# Patient Record
Sex: Female | Born: 1945
Health system: Southern US, Community
[De-identification: ages and names within clinical notes are randomized; demographics above are authoritative.]

## PROBLEM LIST (undated history)

## (undated) DIAGNOSIS — M8580 Other specified disorders of bone density and structure, unspecified site: Secondary | ICD-10-CM

## (undated) DIAGNOSIS — I951 Orthostatic hypotension: Secondary | ICD-10-CM

## (undated) DIAGNOSIS — M419 Scoliosis, unspecified: Secondary | ICD-10-CM

## (undated) DIAGNOSIS — E7800 Pure hypercholesterolemia, unspecified: Secondary | ICD-10-CM

## (undated) DIAGNOSIS — R42 Dizziness and giddiness: Secondary | ICD-10-CM

## (undated) HISTORY — PX: WRIST SURGERY: SHX841

## (undated) HISTORY — PX: RETINAL DETACHMENT SURGERY: SHX105

## (undated) HISTORY — PX: TONSILLECTOMY: SUR1361

## (undated) HISTORY — PX: TUBAL LIGATION: SHX77

## (undated) HISTORY — DX: Dizziness and giddiness: R42

## (undated) HISTORY — PX: EYE SURGERY: SHX253

---

## 1999-02-12 ENCOUNTER — Other Ambulatory Visit: Admission: RE | Admit: 1999-02-12 | Discharge: 1999-02-12 | Payer: Self-pay | Admitting: *Deleted

## 2000-02-23 ENCOUNTER — Other Ambulatory Visit: Admission: RE | Admit: 2000-02-23 | Discharge: 2000-02-23 | Payer: Self-pay | Admitting: *Deleted

## 2000-06-01 ENCOUNTER — Encounter: Payer: Self-pay | Admitting: *Deleted

## 2000-06-01 ENCOUNTER — Encounter: Admission: RE | Admit: 2000-06-01 | Discharge: 2000-06-01 | Payer: Self-pay | Admitting: *Deleted

## 2001-04-04 ENCOUNTER — Other Ambulatory Visit: Admission: RE | Admit: 2001-04-04 | Discharge: 2001-04-04 | Payer: Self-pay | Admitting: *Deleted

## 2001-06-13 ENCOUNTER — Encounter: Admission: RE | Admit: 2001-06-13 | Discharge: 2001-06-13 | Payer: Self-pay | Admitting: *Deleted

## 2001-06-13 ENCOUNTER — Encounter: Payer: Self-pay | Admitting: *Deleted

## 2002-04-26 ENCOUNTER — Other Ambulatory Visit: Admission: RE | Admit: 2002-04-26 | Discharge: 2002-04-26 | Payer: Self-pay | Admitting: *Deleted

## 2002-06-27 ENCOUNTER — Encounter: Admission: RE | Admit: 2002-06-27 | Discharge: 2002-06-27 | Payer: Self-pay | Admitting: *Deleted

## 2002-06-27 ENCOUNTER — Encounter: Payer: Self-pay | Admitting: *Deleted

## 2003-07-04 ENCOUNTER — Encounter: Admission: RE | Admit: 2003-07-04 | Discharge: 2003-07-04 | Payer: Self-pay | Admitting: *Deleted

## 2003-10-23 ENCOUNTER — Ambulatory Visit (HOSPITAL_COMMUNITY): Admission: RE | Admit: 2003-10-23 | Discharge: 2003-10-24 | Payer: Self-pay | Admitting: Ophthalmology

## 2004-07-20 ENCOUNTER — Other Ambulatory Visit: Admission: RE | Admit: 2004-07-20 | Discharge: 2004-07-20 | Payer: Self-pay | Admitting: *Deleted

## 2004-09-07 ENCOUNTER — Encounter: Admission: RE | Admit: 2004-09-07 | Discharge: 2004-09-07 | Payer: Self-pay | Admitting: *Deleted

## 2004-09-18 ENCOUNTER — Encounter: Admission: RE | Admit: 2004-09-18 | Discharge: 2004-09-18 | Payer: Self-pay | Admitting: *Deleted

## 2005-12-07 ENCOUNTER — Encounter: Admission: RE | Admit: 2005-12-07 | Discharge: 2005-12-07 | Payer: Self-pay | Admitting: *Deleted

## 2006-09-15 ENCOUNTER — Other Ambulatory Visit: Admission: RE | Admit: 2006-09-15 | Discharge: 2006-09-15 | Payer: Self-pay | Admitting: *Deleted

## 2006-12-14 ENCOUNTER — Encounter: Admission: RE | Admit: 2006-12-14 | Discharge: 2006-12-14 | Payer: Self-pay | Admitting: *Deleted

## 2007-09-19 ENCOUNTER — Other Ambulatory Visit: Admission: RE | Admit: 2007-09-19 | Discharge: 2007-09-19 | Payer: Self-pay | Admitting: Family Medicine

## 2008-10-17 ENCOUNTER — Ambulatory Visit (HOSPITAL_BASED_OUTPATIENT_CLINIC_OR_DEPARTMENT_OTHER): Admission: RE | Admit: 2008-10-17 | Discharge: 2008-10-17 | Payer: Self-pay | Admitting: Orthopedic Surgery

## 2009-10-13 ENCOUNTER — Other Ambulatory Visit: Admission: RE | Admit: 2009-10-13 | Discharge: 2009-10-13 | Payer: Self-pay | Admitting: Family Medicine

## 2010-12-08 LAB — POCT HEMOGLOBIN-HEMACUE: Hemoglobin: 14 g/dL (ref 12.0–15.0)

## 2011-01-05 NOTE — Op Note (Signed)
NAME:  Jodi Cardenas, DRENNEN              ACCOUNT NO.:  [id number]   MEDICAL RECORD NO.:  [id number]          PATIENT TYPE:  AMB   LOCATION:  DSC                          FACILITY:  [facility]   PHYSICIAN:  Katy Fitch. Sypher, M.D. DATE OF BIRTH:  1946-06-22   DATE OF PROCEDURE:  10/17/2008  DATE OF DISCHARGE:                               OPERATIVE REPORT   PREOPERATIVE DIAGNOSIS:  Chronic stenosing tenosynovitis, right first  dorsal compartment unresponsive to splinting, injection, and activity  modification.   POSTOPERATIVE DIAGNOSIS:  Chronic stenosing tenosynovitis, right first  dorsal compartment unresponsive to splinting, injection, and activity  modification.   OPERATION:  Release of right first dorsal compartment.   OPERATING SURGEON:  Katy Fitch. Sypher, MD   ASSISTANT:  Marveen Reeks Dasnoit, PA-C   ANESTHESIA:  2% lidocaine, field block, and first dorsal compartment  block supplemented by IV sedation.   SUPERVISING ANESTHESIOLOGIST:  Zenon Mayo, MD   INDICATIONS:  [name] is a 65 year old woman referred for  evaluation and management by Dr. Juluis Rainier.  She was noted to  have chronic stenosing tenosynovitis of her right first dorsal  compartment.  This was unresponsive to splinting, activity modification,  and steroid injection.   Due to failed response to nonoperative measures, she is brought to the  operating room at this time for release of first dorsal compartment.   PROCEDURE:  [name] was brought to the operating room and placed  in supine position on the operating table.   Following the induction of light sedation, the right arm was prepped  with Betadine soap and solution, sterilely draped.  A 2% lidocaine was  infiltrated into the path of intended incision and into the first dorsal  compartment region.   The arm was then prepped with Betadine soap and solution, sterilely  draped.  On exsanguination of the right arm with an Esmarch  bandage,  arterial tourniquet was inflated to 220 mmHg.  Procedure commenced with  short 1-cm transverse incision directly over the palpably thickened  first dorsal compartment.  There was chronic inflammatory changes with  subcutaneous fibrosis matting the radial superficial sensory branches to  the compartment.  These were gently cleared with tenotomy scissors,  dissection, and use of a micro Freer.  Blunt retractors were placed.  First dorsal compartment was opaque and thickened to a 3-mm wall  diameter.  This was carefully incised along the axis of the tendons.  Two large tendons were identified within the compartment.  The dorsal  tendon with traction extended both the MP and IP joint of the thumb.  This was an anatomic variant that functioned as an extensor pollicis  longus and an extensor pollicis brevis.  The second tendon was an  abductor pollicis longus.  There were not multiple slips as typically  found.   We could not identify any septum between the tendons.   After complete release proximally and distally, the wound was inspected  for bleeding points and repaired with intradermal 3-0 Prolene and Steri-  Strips.   For aftercare, Ms. Lobello is advised to begin immediate range of motion  exercises to fingers and thumb.  We will see her back for followup in  our office in a week for suture removal.  There were no apparent  complications.      Katy Fitch Sypher, M.D.  Electronically Signed     RVS/MEDQ  D:  10/17/2008  T:  10/17/2008  Job:  [id number]   cc:   Dossie Der, MD

## 2011-01-08 NOTE — Discharge Summary (Signed)
NAME:  HOLIDAY, MCMENAMIN                          ACCOUNT NO.:  [id number]   MEDICAL RECORD NO.:  [id number]                   PATIENT TYPE:  OIB   LOCATION:  5729                                 FACILITY:  [facility]   PHYSICIAN:  Guadelupe Sabin, M.D.             DATE OF BIRTH:  01/30/1946   DATE OF ADMISSION:  10/23/2003  DATE OF DISCHARGE:  10/23/2003                                 DISCHARGE SUMMARY   This was an urgent outpatient admission of this 65 year old white female  admitted with a rhegmatogenous retinal detachment of the right eye.  The  patient had had previous retinal tears treated in the left eye in 2003.  The  patient had a positive family history of a mother with retinal detachments  in both eyes which I repaired.   HOSPITAL COURSE:  The patient was taken to the operating room where the  patient was evaluated and felt to be in satisfactory condition for the  proposed surgery.  The patient was operated on with a scleral buckling  procedure using solid silicone implants, cryo application, diathermy  application, external drainage of subretinal fluid, and intravitreal air  injection.  The patient tolerated the procedure under general anesthesia and  was taken to the recovery room and subsequently to the 23 hour observation  unit.  The patient was seen on the evening of surgery and the following  morning.  At the time of discharge, the vitreous was clear with small  residual air bubble superiorly tamponading the large retinal tear.  The  subretinal fluid had absorbed and the retina was reattached with a good  buckling indentation.  It was felt that the patient had achieved maximal  hospital benefit and could be discharged home to be follow in the office.  The patient was given a printed list of discharge instructions on the care  and use of the operative eye.  Discharge ocular medications as outlined were  TobraDex ophthalmic solution 1 drop four times a day, Cyclomydril  ophthalmic  solution 1 drop four times a day, Maxitrol and Atropine ointment at bedtime.   CONDITION ON DISCHARGE:  Improved.   DISCHARGE DIAGNOSIS:  1. Rhegmatogenous retinal detachment, right eye, multiple breaks.  2. History of retinal horseshoe tears, left eye, 2003.                                                Guadelupe Sabin, M.D.    HNJ/MEDQ  D:  10/24/2003  T:  10/24/2003  Job:  [id number]   cc:   Dr. Maryagnes Amos T. Nile Riggs, M.D.  Fax: [phone]

## 2011-01-08 NOTE — Op Note (Signed)
NAME:  Jodi Cardenas, Jodi Cardenas                          ACCOUNT NO.:  [id number]   MEDICAL RECORD NO.:  [id number]                   PATIENT TYPE:  OIB   LOCATION:  5729                                 FACILITY:  [facility]   PHYSICIAN:  Guadelupe Sabin, M.D.             DATE OF BIRTH:  Jul 13, 1946   DATE OF PROCEDURE:  10/23/2003  DATE OF DISCHARGE:                                 OPERATIVE REPORT   PREOPERATIVE DIAGNOSIS:  Rhegmatogenous retinal detachment, right eye,  multiple breaks.   POSTOPERATIVE DIAGNOSIS:  Rhegmatogenous retinal detachment, right eye,  multiple breaks.   OPERATION:  Scleral buckling procedure, right eye, using solid silicone  implants, #277 and #240, cryo application, diathermy application, external  drainage of subretinal fluid, and intravitreal air injection.   SURGEON:  Guadelupe Sabin, M.D.   ASSISTANT:  Nurse   ANESTHESIA:  General.   OPHTHALMOSCOPY:  As previously described.   OPERATIVE PROCEDURE:  After the patient was prepped and draped, lid traction  sutures were placed in the right upper and lower lids.  A lid speculum was  inserted.  A peritomy was performed adjacent to the limbus 360 degrees.  The  subconjunctival tissue was cleaned and the rectus muscles isolated and  looped with 4-0 silk traction sutures.  The sclera was inspected and found  to be in satisfactory condition for the scleral dissection from the 10 to 2  o'clock position.  Thinning was noted at the 9:30 position.  Indirect  ophthalmoscopy and localization was performed using the retinal cryoprobe.  Each retinal tear was treated in several small degenerative areas.  Lamellar  scleral dissection was then performed from the 10 to 2 o'clock position, the  bed measuring 9 mm in width.  Light diathermy applications were applied to  the intrascleral lamella.  A #277 solid silicone implant was placed under  four 4-0 green Mersilene sutures with 3-0 plain catgut reinforcements in the  dissected bed.  A #240 encircling band was placed about the globe and tied  with a suture at the 8 o'clock position.  Anchoring sutures were placed at  the 7:30 and 4 o'clock position to hold the encircling band in place.  After  repeat indirect ophthalmoscopy, it was elected to drain fluid in the bed at  the 10 o'clock position.  The incision was made through the intrascleral  lamella, the choroid exposed, treated with light diathermy, and then  perforated with the pin electrode.  An abundant amount of clear, subretinal  fluid drained.  The eye became hypotonus.  It was then elected to inject  approximately 0.5 to 0.6 mL of air into the vitreous cavity with a 30 gauge  needle at the 8 o'clock position 3.5 mm from the limbus.  This re-  established the intraocular pressure and tamponaded the retinal break.  Repeat indirect ophthalmoscopy showed the break to be on the implant and in  good position.  A small amount of residual subretinal fluid remained but it  was felt that it would absorb during the postoperative period with the air  tamponade.  It was then elected to close.  The sutures were tightened  permanently and tied.  The tension of the encircling band was adjusted.  The  patient had been given Diamox 500 mg early in the procedure to continue  lowering the intraocular pressure.  Tenon's capsule was pulled forward in  the four quadrants and tied as a separate layer.  Full strength Neosporin  was irrigated in the subtenon's space.  The conjunctiva was then pulled  forward and closed with a running 6-0 chromic catgut suture.  Depo-Garamycin  and Dexamethasone were injected in the sub-tenon space inferiorly.  Atropine  and Maxitrol ophthalmic  ointment were instilled in the conjunctival cul-de-sac.  A light patch and  protective shield were applied.  Duration of the procedure 1 1/2 hours.  The  patient tolerated the procedure well in general and left the operating room  for the recovery  room in good condition.                                               Guadelupe Sabin, M.D.    HNJ/MEDQ  D:  10/24/2003  T:  10/24/2003  Job:  [id number]   cc:   Dr. Maryagnes Amos T. Nile Riggs, M.D.  Fax: [phone]

## 2011-01-08 NOTE — H&P (Signed)
NAME:  Jodi Cardenas, Jodi Cardenas                          ACCOUNT NO.:  [id number]   MEDICAL RECORD NO.:  [id number]                   PATIENT TYPE:  OIB   LOCATION:  5729                                 FACILITY:  [facility]   PHYSICIAN:  Guadelupe Sabin, M.D.             DATE OF BIRTH:  04-15-1946   DATE OF ADMISSION:  10/23/2003  DATE OF DISCHARGE:                                HISTORY & PHYSICAL   REASON FOR ADMISSION:  This was an urgent outpatient admission of this 65-  year-old white female admitted with a rhegmatogenous retinal detachment of  the right eye.   HISTORY OF PRESENT ILLNESS:  This patient was seen in my office in July 2003  and was found to have retinal horseshoe tears of the left eye.  These were  treated in the office with transconjunctival cryopexy.  The patient was then  discharged and returned to her regular ophthalmologist, Dr. Jethro Bolus.  Suddenly, she began to note the onset of floaters in her right eye.  She was  seen by Dr. Nile Riggs and noted to have a retinal detachment.  The patient was  referred back to my office where this diagnosis was confirmed and  arrangements made for her outpatient admission at this time.   PAST MEDICAL HISTORY:  The patient is in good general health under the care  of Dr. Janey Greaser.  Dr. Janey Greaser feels the patient is in good general health  taking Fosamax 70 mg a week. No known allergies are present and the patient  is felt to be a good risk for the proposed surgery.   REVIEW OF SYMPTOMS:  No cardiorespiratory complaints.   PHYSICAL EXAMINATION:  VITAL SIGNS:  As recorded on admission.  GENERAL:  The patient is a pleasant, well developed, well nourished, white  female in no acute distress.  HEENT:  Eyes:  Visual acuity without correction less than 20/400 right eye,  20/40 left eye.  Applanation tonometry 15 mm right eye, 21 left eye.  External ocular and slit lamp examination normal.  Detailed retinal drawing  with indirect  ophthalmoscopy and scleral depression of the right eye reveals  a clear vitreous with a retinal detachment in the upper temporal quadrant.  A large, irregular horseshoe tear is noted at the 11 o'clock position and a  separate horseshoe tear at the 7:30 position.  There are several small  atrophic areas present in the retinal periphery.  The macular area is  detached.  Examination of the left eye reveals a clear vitreous with old  cryopexy scars at the 2 o'clock and 8 o'clock position.  No retinal  detachment is present in the left eye.  CHEST:  Lungs clear to percussion and auscultation.  HEART:  Normal sinus rhythm.  No cardiomegaly, no murmurs.  ABDOMEN:  Negative.  EXTREMITIES:  Negative.   ADMISSION DIAGNOSIS:  1. Rhegmatogenous retinal detachment, right eye.  2. History of retinal horseshoe  tears, left eye, in 2003.   PLAN:  Scleral buckling procedure right eye with possible vitrectomy.                                                Guadelupe Sabin, M.D.    HNJ/MEDQ  D:  10/24/2003  T:  10/24/2003  Job:  [id number]   cc:   Dr. Maryagnes Amos T. Nile Riggs, M.D.  Fax: [phone]

## 2011-12-17 DIAGNOSIS — G479 Sleep disorder, unspecified: Secondary | ICD-10-CM | POA: Diagnosis not present

## 2011-12-17 DIAGNOSIS — M899 Disorder of bone, unspecified: Secondary | ICD-10-CM | POA: Diagnosis not present

## 2011-12-17 DIAGNOSIS — Z79899 Other long term (current) drug therapy: Secondary | ICD-10-CM | POA: Diagnosis not present

## 2011-12-17 DIAGNOSIS — E7800 Pure hypercholesterolemia, unspecified: Secondary | ICD-10-CM | POA: Diagnosis not present

## 2011-12-17 DIAGNOSIS — M949 Disorder of cartilage, unspecified: Secondary | ICD-10-CM | POA: Diagnosis not present

## 2011-12-24 DIAGNOSIS — Z8262 Family history of osteoporosis: Secondary | ICD-10-CM | POA: Diagnosis not present

## 2011-12-24 DIAGNOSIS — M899 Disorder of bone, unspecified: Secondary | ICD-10-CM | POA: Diagnosis not present

## 2011-12-24 DIAGNOSIS — Z1231 Encounter for screening mammogram for malignant neoplasm of breast: Secondary | ICD-10-CM | POA: Diagnosis not present

## 2011-12-24 DIAGNOSIS — M949 Disorder of cartilage, unspecified: Secondary | ICD-10-CM | POA: Diagnosis not present

## 2012-04-19 DIAGNOSIS — K5730 Diverticulosis of large intestine without perforation or abscess without bleeding: Secondary | ICD-10-CM | POA: Diagnosis not present

## 2012-04-19 DIAGNOSIS — Z86010 Personal history of colonic polyps: Secondary | ICD-10-CM | POA: Diagnosis not present

## 2012-04-19 DIAGNOSIS — Z09 Encounter for follow-up examination after completed treatment for conditions other than malignant neoplasm: Secondary | ICD-10-CM | POA: Diagnosis not present

## 2012-05-22 DIAGNOSIS — Z961 Presence of intraocular lens: Secondary | ICD-10-CM | POA: Diagnosis not present

## 2012-06-16 DIAGNOSIS — G479 Sleep disorder, unspecified: Secondary | ICD-10-CM | POA: Diagnosis not present

## 2012-06-16 DIAGNOSIS — Z5189 Encounter for other specified aftercare: Secondary | ICD-10-CM | POA: Diagnosis not present

## 2012-06-16 DIAGNOSIS — E7800 Pure hypercholesterolemia, unspecified: Secondary | ICD-10-CM | POA: Diagnosis not present

## 2012-06-16 DIAGNOSIS — Z23 Encounter for immunization: Secondary | ICD-10-CM | POA: Diagnosis not present

## 2012-06-16 DIAGNOSIS — Z1331 Encounter for screening for depression: Secondary | ICD-10-CM | POA: Diagnosis not present

## 2012-12-15 DIAGNOSIS — M899 Disorder of bone, unspecified: Secondary | ICD-10-CM | POA: Diagnosis not present

## 2012-12-15 DIAGNOSIS — Z79899 Other long term (current) drug therapy: Secondary | ICD-10-CM | POA: Diagnosis not present

## 2012-12-15 DIAGNOSIS — M949 Disorder of cartilage, unspecified: Secondary | ICD-10-CM | POA: Diagnosis not present

## 2012-12-15 DIAGNOSIS — E7800 Pure hypercholesterolemia, unspecified: Secondary | ICD-10-CM | POA: Diagnosis not present

## 2012-12-15 DIAGNOSIS — G479 Sleep disorder, unspecified: Secondary | ICD-10-CM | POA: Diagnosis not present

## 2012-12-15 DIAGNOSIS — Z1331 Encounter for screening for depression: Secondary | ICD-10-CM | POA: Diagnosis not present

## 2013-01-02 DIAGNOSIS — Z1231 Encounter for screening mammogram for malignant neoplasm of breast: Secondary | ICD-10-CM | POA: Diagnosis not present

## 2013-05-31 DIAGNOSIS — Z961 Presence of intraocular lens: Secondary | ICD-10-CM | POA: Diagnosis not present

## 2013-09-11 DIAGNOSIS — N950 Postmenopausal bleeding: Secondary | ICD-10-CM | POA: Diagnosis not present

## 2013-09-13 DIAGNOSIS — M25519 Pain in unspecified shoulder: Secondary | ICD-10-CM | POA: Diagnosis not present

## 2013-09-14 DIAGNOSIS — N840 Polyp of corpus uteri: Secondary | ICD-10-CM | POA: Diagnosis not present

## 2013-09-18 ENCOUNTER — Ambulatory Visit: Payer: Medicare Other | Attending: Family Medicine | Admitting: Physical Therapy

## 2013-09-18 DIAGNOSIS — IMO0001 Reserved for inherently not codable concepts without codable children: Secondary | ICD-10-CM | POA: Diagnosis not present

## 2013-09-18 DIAGNOSIS — M25519 Pain in unspecified shoulder: Secondary | ICD-10-CM | POA: Diagnosis not present

## 2013-09-25 ENCOUNTER — Ambulatory Visit: Payer: Medicare Other | Attending: Family Medicine | Admitting: Physical Therapy

## 2013-09-25 DIAGNOSIS — M25519 Pain in unspecified shoulder: Secondary | ICD-10-CM | POA: Diagnosis not present

## 2013-09-25 DIAGNOSIS — IMO0001 Reserved for inherently not codable concepts without codable children: Secondary | ICD-10-CM | POA: Diagnosis not present

## 2013-09-26 DIAGNOSIS — M899 Disorder of bone, unspecified: Secondary | ICD-10-CM | POA: Diagnosis not present

## 2013-09-26 DIAGNOSIS — Z23 Encounter for immunization: Secondary | ICD-10-CM | POA: Diagnosis not present

## 2013-09-26 DIAGNOSIS — Z79899 Other long term (current) drug therapy: Secondary | ICD-10-CM | POA: Diagnosis not present

## 2013-09-26 DIAGNOSIS — M949 Disorder of cartilage, unspecified: Secondary | ICD-10-CM | POA: Diagnosis not present

## 2013-09-26 DIAGNOSIS — E7800 Pure hypercholesterolemia, unspecified: Secondary | ICD-10-CM | POA: Diagnosis not present

## 2013-10-02 ENCOUNTER — Ambulatory Visit: Payer: Medicare Other | Admitting: Physical Therapy

## 2013-10-04 ENCOUNTER — Ambulatory Visit: Payer: Medicare Other | Admitting: Physical Therapy

## 2013-10-08 ENCOUNTER — Ambulatory Visit: Payer: Medicare Other | Admitting: Physical Therapy

## 2013-10-11 ENCOUNTER — Ambulatory Visit: Payer: Medicare Other | Admitting: Physical Therapy

## 2013-10-16 ENCOUNTER — Ambulatory Visit: Payer: Medicare Other | Admitting: Physical Therapy

## 2013-10-29 ENCOUNTER — Ambulatory Visit: Payer: Medicare Other | Attending: Family Medicine | Admitting: Physical Therapy

## 2013-10-29 DIAGNOSIS — IMO0001 Reserved for inherently not codable concepts without codable children: Secondary | ICD-10-CM | POA: Diagnosis not present

## 2013-10-29 DIAGNOSIS — M25519 Pain in unspecified shoulder: Secondary | ICD-10-CM | POA: Insufficient documentation

## 2013-11-05 ENCOUNTER — Ambulatory Visit: Payer: Medicare Other | Admitting: Physical Therapy

## 2013-11-05 DIAGNOSIS — M25519 Pain in unspecified shoulder: Secondary | ICD-10-CM | POA: Diagnosis not present

## 2013-11-05 DIAGNOSIS — IMO0001 Reserved for inherently not codable concepts without codable children: Secondary | ICD-10-CM | POA: Diagnosis not present

## 2013-11-08 ENCOUNTER — Ambulatory Visit: Payer: Medicare Other | Admitting: Physical Therapy

## 2013-11-08 DIAGNOSIS — M25519 Pain in unspecified shoulder: Secondary | ICD-10-CM | POA: Diagnosis not present

## 2013-11-08 DIAGNOSIS — IMO0001 Reserved for inherently not codable concepts without codable children: Secondary | ICD-10-CM | POA: Diagnosis not present

## 2013-12-13 DIAGNOSIS — H10509 Unspecified blepharoconjunctivitis, unspecified eye: Secondary | ICD-10-CM | POA: Diagnosis not present

## 2014-02-05 DIAGNOSIS — M899 Disorder of bone, unspecified: Secondary | ICD-10-CM | POA: Diagnosis not present

## 2014-02-05 DIAGNOSIS — M949 Disorder of cartilage, unspecified: Secondary | ICD-10-CM | POA: Diagnosis not present

## 2014-02-05 DIAGNOSIS — Z1231 Encounter for screening mammogram for malignant neoplasm of breast: Secondary | ICD-10-CM | POA: Diagnosis not present

## 2014-03-28 DIAGNOSIS — M899 Disorder of bone, unspecified: Secondary | ICD-10-CM | POA: Diagnosis not present

## 2014-03-28 DIAGNOSIS — M949 Disorder of cartilage, unspecified: Secondary | ICD-10-CM | POA: Diagnosis not present

## 2014-03-28 DIAGNOSIS — E7800 Pure hypercholesterolemia, unspecified: Secondary | ICD-10-CM | POA: Diagnosis not present

## 2014-03-28 DIAGNOSIS — Z1331 Encounter for screening for depression: Secondary | ICD-10-CM | POA: Diagnosis not present

## 2014-03-28 DIAGNOSIS — Z79899 Other long term (current) drug therapy: Secondary | ICD-10-CM | POA: Diagnosis not present

## 2014-06-26 DIAGNOSIS — Z23 Encounter for immunization: Secondary | ICD-10-CM | POA: Diagnosis not present

## 2014-07-01 DIAGNOSIS — Z961 Presence of intraocular lens: Secondary | ICD-10-CM | POA: Diagnosis not present

## 2014-10-01 DIAGNOSIS — Z23 Encounter for immunization: Secondary | ICD-10-CM | POA: Diagnosis not present

## 2014-10-01 DIAGNOSIS — G479 Sleep disorder, unspecified: Secondary | ICD-10-CM | POA: Diagnosis not present

## 2014-10-01 DIAGNOSIS — E780 Pure hypercholesterolemia: Secondary | ICD-10-CM | POA: Diagnosis not present

## 2014-10-01 DIAGNOSIS — Z79899 Other long term (current) drug therapy: Secondary | ICD-10-CM | POA: Diagnosis not present

## 2014-10-01 DIAGNOSIS — R8290 Unspecified abnormal findings in urine: Secondary | ICD-10-CM | POA: Diagnosis not present

## 2014-10-01 DIAGNOSIS — R3915 Urgency of urination: Secondary | ICD-10-CM | POA: Diagnosis not present

## 2015-04-15 DIAGNOSIS — E780 Pure hypercholesterolemia: Secondary | ICD-10-CM | POA: Diagnosis not present

## 2015-04-15 DIAGNOSIS — Z79899 Other long term (current) drug therapy: Secondary | ICD-10-CM | POA: Diagnosis not present

## 2015-04-15 DIAGNOSIS — K5900 Constipation, unspecified: Secondary | ICD-10-CM | POA: Diagnosis not present

## 2015-04-15 DIAGNOSIS — M549 Dorsalgia, unspecified: Secondary | ICD-10-CM | POA: Diagnosis not present

## 2015-04-15 DIAGNOSIS — Z1389 Encounter for screening for other disorder: Secondary | ICD-10-CM | POA: Diagnosis not present

## 2015-04-15 DIAGNOSIS — G479 Sleep disorder, unspecified: Secondary | ICD-10-CM | POA: Diagnosis not present

## 2015-04-15 DIAGNOSIS — R3915 Urgency of urination: Secondary | ICD-10-CM | POA: Diagnosis not present

## 2015-04-15 DIAGNOSIS — M419 Scoliosis, unspecified: Secondary | ICD-10-CM | POA: Diagnosis not present

## 2015-04-21 DIAGNOSIS — M545 Low back pain: Secondary | ICD-10-CM | POA: Diagnosis not present

## 2015-04-24 DIAGNOSIS — M419 Scoliosis, unspecified: Secondary | ICD-10-CM | POA: Diagnosis not present

## 2015-04-24 DIAGNOSIS — M545 Low back pain: Secondary | ICD-10-CM | POA: Diagnosis not present

## 2015-04-24 DIAGNOSIS — Z1231 Encounter for screening mammogram for malignant neoplasm of breast: Secondary | ICD-10-CM | POA: Diagnosis not present

## 2015-04-30 DIAGNOSIS — M419 Scoliosis, unspecified: Secondary | ICD-10-CM | POA: Diagnosis not present

## 2015-04-30 DIAGNOSIS — M545 Low back pain: Secondary | ICD-10-CM | POA: Diagnosis not present

## 2015-05-13 DIAGNOSIS — M419 Scoliosis, unspecified: Secondary | ICD-10-CM | POA: Diagnosis not present

## 2015-05-13 DIAGNOSIS — M545 Low back pain: Secondary | ICD-10-CM | POA: Diagnosis not present

## 2015-05-15 DIAGNOSIS — M545 Low back pain: Secondary | ICD-10-CM | POA: Diagnosis not present

## 2015-05-15 DIAGNOSIS — M419 Scoliosis, unspecified: Secondary | ICD-10-CM | POA: Diagnosis not present

## 2015-05-23 DIAGNOSIS — M545 Low back pain: Secondary | ICD-10-CM | POA: Diagnosis not present

## 2015-05-23 DIAGNOSIS — M419 Scoliosis, unspecified: Secondary | ICD-10-CM | POA: Diagnosis not present

## 2015-05-26 DIAGNOSIS — M419 Scoliosis, unspecified: Secondary | ICD-10-CM | POA: Diagnosis not present

## 2015-05-26 DIAGNOSIS — M545 Low back pain: Secondary | ICD-10-CM | POA: Diagnosis not present

## 2015-05-29 DIAGNOSIS — M545 Low back pain: Secondary | ICD-10-CM | POA: Diagnosis not present

## 2015-05-29 DIAGNOSIS — M419 Scoliosis, unspecified: Secondary | ICD-10-CM | POA: Diagnosis not present

## 2015-05-30 DIAGNOSIS — M545 Low back pain: Secondary | ICD-10-CM | POA: Diagnosis not present

## 2015-06-03 DIAGNOSIS — M545 Low back pain: Secondary | ICD-10-CM | POA: Diagnosis not present

## 2015-06-03 DIAGNOSIS — M419 Scoliosis, unspecified: Secondary | ICD-10-CM | POA: Diagnosis not present

## 2015-06-05 DIAGNOSIS — M545 Low back pain: Secondary | ICD-10-CM | POA: Diagnosis not present

## 2015-06-05 DIAGNOSIS — M419 Scoliosis, unspecified: Secondary | ICD-10-CM | POA: Diagnosis not present

## 2015-06-10 DIAGNOSIS — Z23 Encounter for immunization: Secondary | ICD-10-CM | POA: Diagnosis not present

## 2015-06-10 DIAGNOSIS — M545 Low back pain: Secondary | ICD-10-CM | POA: Diagnosis not present

## 2015-06-10 DIAGNOSIS — M419 Scoliosis, unspecified: Secondary | ICD-10-CM | POA: Diagnosis not present

## 2015-06-12 DIAGNOSIS — M545 Low back pain: Secondary | ICD-10-CM | POA: Diagnosis not present

## 2015-06-12 DIAGNOSIS — M419 Scoliosis, unspecified: Secondary | ICD-10-CM | POA: Diagnosis not present

## 2015-06-17 DIAGNOSIS — M419 Scoliosis, unspecified: Secondary | ICD-10-CM | POA: Diagnosis not present

## 2015-06-17 DIAGNOSIS — M545 Low back pain: Secondary | ICD-10-CM | POA: Diagnosis not present

## 2015-06-19 DIAGNOSIS — M545 Low back pain: Secondary | ICD-10-CM | POA: Diagnosis not present

## 2015-06-19 DIAGNOSIS — M419 Scoliosis, unspecified: Secondary | ICD-10-CM | POA: Diagnosis not present

## 2015-07-01 DIAGNOSIS — M419 Scoliosis, unspecified: Secondary | ICD-10-CM | POA: Diagnosis not present

## 2015-07-01 DIAGNOSIS — M545 Low back pain: Secondary | ICD-10-CM | POA: Diagnosis not present

## 2015-07-07 DIAGNOSIS — M546 Pain in thoracic spine: Secondary | ICD-10-CM | POA: Diagnosis not present

## 2015-07-14 DIAGNOSIS — M47816 Spondylosis without myelopathy or radiculopathy, lumbar region: Secondary | ICD-10-CM | POA: Diagnosis not present

## 2015-07-30 DIAGNOSIS — M47816 Spondylosis without myelopathy or radiculopathy, lumbar region: Secondary | ICD-10-CM | POA: Diagnosis not present

## 2015-08-04 DIAGNOSIS — Z961 Presence of intraocular lens: Secondary | ICD-10-CM | POA: Diagnosis not present

## 2015-08-29 DIAGNOSIS — M545 Low back pain: Secondary | ICD-10-CM | POA: Diagnosis not present

## 2015-09-16 DIAGNOSIS — M545 Low back pain: Secondary | ICD-10-CM | POA: Diagnosis not present

## 2015-09-19 DIAGNOSIS — M47816 Spondylosis without myelopathy or radiculopathy, lumbar region: Secondary | ICD-10-CM | POA: Diagnosis not present

## 2015-10-23 DIAGNOSIS — E7800 Pure hypercholesterolemia, unspecified: Secondary | ICD-10-CM | POA: Diagnosis not present

## 2015-10-23 DIAGNOSIS — Z79899 Other long term (current) drug therapy: Secondary | ICD-10-CM | POA: Diagnosis not present

## 2015-10-23 DIAGNOSIS — G479 Sleep disorder, unspecified: Secondary | ICD-10-CM | POA: Diagnosis not present

## 2015-10-23 DIAGNOSIS — R3915 Urgency of urination: Secondary | ICD-10-CM | POA: Diagnosis not present

## 2015-10-23 DIAGNOSIS — M8580 Other specified disorders of bone density and structure, unspecified site: Secondary | ICD-10-CM | POA: Diagnosis not present

## 2015-12-08 DIAGNOSIS — Z0000 Encounter for general adult medical examination without abnormal findings: Secondary | ICD-10-CM | POA: Diagnosis not present

## 2015-12-08 DIAGNOSIS — R350 Frequency of micturition: Secondary | ICD-10-CM | POA: Diagnosis not present

## 2015-12-08 DIAGNOSIS — R32 Unspecified urinary incontinence: Secondary | ICD-10-CM | POA: Diagnosis not present

## 2015-12-08 DIAGNOSIS — R338 Other retention of urine: Secondary | ICD-10-CM | POA: Diagnosis not present

## 2015-12-08 DIAGNOSIS — R351 Nocturia: Secondary | ICD-10-CM | POA: Diagnosis not present

## 2015-12-08 DIAGNOSIS — N3941 Urge incontinence: Secondary | ICD-10-CM | POA: Diagnosis not present

## 2015-12-15 DIAGNOSIS — M47816 Spondylosis without myelopathy or radiculopathy, lumbar region: Secondary | ICD-10-CM | POA: Diagnosis not present

## 2016-01-06 DIAGNOSIS — M47816 Spondylosis without myelopathy or radiculopathy, lumbar region: Secondary | ICD-10-CM | POA: Diagnosis not present

## 2016-01-12 DIAGNOSIS — R350 Frequency of micturition: Secondary | ICD-10-CM | POA: Diagnosis not present

## 2016-01-12 DIAGNOSIS — N3941 Urge incontinence: Secondary | ICD-10-CM | POA: Diagnosis not present

## 2016-01-12 DIAGNOSIS — Z0000 Encounter for general adult medical examination without abnormal findings: Secondary | ICD-10-CM | POA: Diagnosis not present

## 2016-01-13 DIAGNOSIS — M47816 Spondylosis without myelopathy or radiculopathy, lumbar region: Secondary | ICD-10-CM | POA: Diagnosis not present

## 2016-01-16 DIAGNOSIS — M47816 Spondylosis without myelopathy or radiculopathy, lumbar region: Secondary | ICD-10-CM | POA: Diagnosis not present

## 2016-01-22 DIAGNOSIS — M47816 Spondylosis without myelopathy or radiculopathy, lumbar region: Secondary | ICD-10-CM | POA: Diagnosis not present

## 2016-02-17 DIAGNOSIS — M8589 Other specified disorders of bone density and structure, multiple sites: Secondary | ICD-10-CM | POA: Diagnosis not present

## 2016-02-27 DIAGNOSIS — M47816 Spondylosis without myelopathy or radiculopathy, lumbar region: Secondary | ICD-10-CM | POA: Diagnosis not present

## 2016-03-15 DIAGNOSIS — N3941 Urge incontinence: Secondary | ICD-10-CM | POA: Diagnosis not present

## 2016-03-15 DIAGNOSIS — R350 Frequency of micturition: Secondary | ICD-10-CM | POA: Diagnosis not present

## 2016-03-17 DIAGNOSIS — M8588 Other specified disorders of bone density and structure, other site: Secondary | ICD-10-CM | POA: Diagnosis not present

## 2016-03-19 DIAGNOSIS — M47816 Spondylosis without myelopathy or radiculopathy, lumbar region: Secondary | ICD-10-CM | POA: Diagnosis not present

## 2016-04-20 DIAGNOSIS — M47816 Spondylosis without myelopathy or radiculopathy, lumbar region: Secondary | ICD-10-CM | POA: Diagnosis not present

## 2016-05-11 DIAGNOSIS — Z79899 Other long term (current) drug therapy: Secondary | ICD-10-CM | POA: Diagnosis not present

## 2016-05-11 DIAGNOSIS — Z1389 Encounter for screening for other disorder: Secondary | ICD-10-CM | POA: Diagnosis not present

## 2016-05-11 DIAGNOSIS — E7800 Pure hypercholesterolemia, unspecified: Secondary | ICD-10-CM | POA: Diagnosis not present

## 2016-05-11 DIAGNOSIS — G479 Sleep disorder, unspecified: Secondary | ICD-10-CM | POA: Diagnosis not present

## 2016-05-11 DIAGNOSIS — Z23 Encounter for immunization: Secondary | ICD-10-CM | POA: Diagnosis not present

## 2016-05-25 DIAGNOSIS — Z1231 Encounter for screening mammogram for malignant neoplasm of breast: Secondary | ICD-10-CM | POA: Diagnosis not present

## 2016-05-26 DIAGNOSIS — M47816 Spondylosis without myelopathy or radiculopathy, lumbar region: Secondary | ICD-10-CM | POA: Diagnosis not present

## 2016-08-24 DIAGNOSIS — M47816 Spondylosis without myelopathy or radiculopathy, lumbar region: Secondary | ICD-10-CM | POA: Diagnosis not present

## 2016-09-20 DIAGNOSIS — M47816 Spondylosis without myelopathy or radiculopathy, lumbar region: Secondary | ICD-10-CM | POA: Diagnosis not present

## 2016-10-11 DIAGNOSIS — M47816 Spondylosis without myelopathy or radiculopathy, lumbar region: Secondary | ICD-10-CM | POA: Diagnosis not present

## 2016-10-11 DIAGNOSIS — M25512 Pain in left shoulder: Secondary | ICD-10-CM | POA: Diagnosis not present

## 2016-11-15 DIAGNOSIS — M8588 Other specified disorders of bone density and structure, other site: Secondary | ICD-10-CM | POA: Diagnosis not present

## 2016-11-15 DIAGNOSIS — Z79899 Other long term (current) drug therapy: Secondary | ICD-10-CM | POA: Diagnosis not present

## 2016-11-15 DIAGNOSIS — G479 Sleep disorder, unspecified: Secondary | ICD-10-CM | POA: Diagnosis not present

## 2016-11-15 DIAGNOSIS — M25519 Pain in unspecified shoulder: Secondary | ICD-10-CM | POA: Diagnosis not present

## 2016-11-15 DIAGNOSIS — M549 Dorsalgia, unspecified: Secondary | ICD-10-CM | POA: Diagnosis not present

## 2016-11-15 DIAGNOSIS — E7800 Pure hypercholesterolemia, unspecified: Secondary | ICD-10-CM | POA: Diagnosis not present

## 2017-01-10 DIAGNOSIS — Z961 Presence of intraocular lens: Secondary | ICD-10-CM | POA: Diagnosis not present

## 2017-01-14 ENCOUNTER — Emergency Department (HOSPITAL_COMMUNITY)
Admission: EM | Admit: 2017-01-14 | Discharge: 2017-01-15 | Disposition: A | Discharge status: Home / Self Care | Payer: Medicare Other | Attending: Emergency Medicine | Admitting: Emergency Medicine

## 2017-01-14 ENCOUNTER — Encounter (HOSPITAL_COMMUNITY): Payer: Self-pay

## 2017-01-14 DIAGNOSIS — R55 Syncope and collapse: Secondary | ICD-10-CM | POA: Diagnosis not present

## 2017-01-14 DIAGNOSIS — R350 Frequency of micturition: Secondary | ICD-10-CM | POA: Diagnosis not present

## 2017-01-14 DIAGNOSIS — N390 Urinary tract infection, site not specified: Secondary | ICD-10-CM | POA: Insufficient documentation

## 2017-01-14 DIAGNOSIS — R42 Dizziness and giddiness: Secondary | ICD-10-CM | POA: Diagnosis present

## 2017-01-14 DIAGNOSIS — I951 Orthostatic hypotension: Secondary | ICD-10-CM | POA: Insufficient documentation

## 2017-01-14 HISTORY — DX: Scoliosis, unspecified: M41.9

## 2017-01-14 HISTORY — DX: Pure hypercholesterolemia, unspecified: E78.00

## 2017-01-14 HISTORY — DX: Other specified disorders of bone density and structure, unspecified site: M85.80

## 2017-01-14 LAB — BASIC METABOLIC PANEL
Anion gap: 8 (ref 5–15)
BUN: 16 mg/dL (ref 6–20)
CALCIUM: 10.6 mg/dL — AB (ref 8.9–10.3)
CO2: 26 mmol/L (ref 22–32)
CREATININE: 0.79 mg/dL (ref 0.44–1.00)
Chloride: 104 mmol/L (ref 101–111)
GFR calc non Af Amer: >60 mL/min (ref >60)
Glucose, Bld: 99 mg/dL (ref 65–99)
Potassium: 3.7 mmol/L (ref 3.5–5.1)
SODIUM: 138 mmol/L (ref 135–145)

## 2017-01-14 LAB — CBC
HCT: 42.3 % (ref 36.0–46.0)
Hemoglobin: 14.1 g/dL (ref 12.0–15.0)
MCH: 31.6 pg (ref 26.0–34.0)
MCHC: 33.3 g/dL (ref 30.0–36.0)
MCV: 94.8 fL (ref 78.0–100.0)
PLATELETS: 210 10*3/uL (ref 150–400)
RBC: 4.46 MIL/uL (ref 3.87–5.11)
RDW: 13.3 % (ref 11.5–15.5)
WBC: 8.3 10*3/uL (ref 4.0–10.5)

## 2017-01-14 LAB — URINALYSIS, ROUTINE W REFLEX MICROSCOPIC
Bilirubin Urine: NEGATIVE
GLUCOSE, UA: NEGATIVE mg/dL
Hgb urine dipstick: NEGATIVE
KETONES UR: NEGATIVE mg/dL
LEUKOCYTES UA: NEGATIVE
Nitrite: POSITIVE — AB
PH: 8 (ref 5.0–8.0)
Protein, ur: NEGATIVE mg/dL
SPECIFIC GRAVITY, URINE: 1.009 (ref 1.005–1.030)
SQUAMOUS EPITHELIAL / LPF: NONE SEEN

## 2017-01-14 LAB — CBG MONITORING, ED: Glucose-Capillary: 94 mg/dL (ref 65–99)

## 2017-01-14 MED ORDER — DEXTROSE 5 % IV SOLN
1.0000 g | Freq: Once | INTRAVENOUS | Status: AC
  Administered 2017-01-14: 1 g via INTRAVENOUS
  Filled 2017-01-14: qty 10

## 2017-01-14 MED ORDER — SODIUM CHLORIDE 0.9 % IV BOLUS (SEPSIS)
1000.0000 mL | Freq: Once | INTRAVENOUS | Status: AC
  Administered 2017-01-14: 1000 mL via INTRAVENOUS

## 2017-01-14 MED ORDER — CEPHALEXIN 500 MG PO CAPS: 500.0000 mg | ORAL_CAPSULE | Freq: Three times a day (TID) | ORAL | 0 refills | Status: DC

## 2017-01-14 NOTE — Discharge Instructions (Signed)
Please take your antibiotic as prescribe for urinary tract infection.  Stay hydrated and follow up closely with your doctor for further care.  Return if you have any concerns.

## 2017-01-14 NOTE — ED Triage Notes (Signed)
Pt sent by [facility] for orthostatic hypotension. For the past 3 weeks she has been feeling dizzy with lack of energy. She went to [facility] today for evaluation and was told she was orthostatic and to come to the ED for further evaluation. Denies any pain, no syncope. A&Ox4.

## 2017-01-14 NOTE — ED Notes (Signed)
Up to br with assistance   Waiting for disposition

## 2017-01-14 NOTE — ED Provider Notes (Signed)
[place] DEPT Provider Note   CSN: [id number] Arrival date & time: 01/14/17  1706     History   Chief Complaint Chief Complaint  Patient presents with  . Orthostatic Hypotension    HPI Jodi Cardenas is a 71 y.o. female.  HPI   71 year old female with history of hypercholesterolemia, osteopenia, scoliosis presenting for evaluation of generalize fatigue and dizziness. Patient states for the past year she hasn't been feeling well. She complaining of recurrent bouts of lightheadedness and dizziness. For the past month she has noticed increased fatigue and dizziness. Symptom worse when she stands for a prolonged period of time. She also endorsed urinary frequency for the past month as well. States that she has been seen and evaluated by her urologist and was given some types of anti-cholinergic medication which did not provide adequate relief. She was seen by her doctor today because she was not feeling well. She was told that she has signs of orthostatic hypotension and recommended to come to the ER for further evaluation. She admits that she doesn't drink enough fluid but otherwise she denies any fever, headache, chest pain, shortness of breath, productive cough, nausea, vomiting, diarrhea or any abnormal bleeding. She was taking statin medication but recently discontinued a few days prior thinking that it may cause the symptoms. She is currently denying pain.  Past Medical History:  Diagnosis Date  . Hypercholesteremia   . Osteopenia   . Scoliosis     There are no active problems to display for this patient.   History reviewed. No pertinent surgical history.  OB History    No data available       Home Medications    Prior to Admission medications   Not on File    Family History No family history on file.  Social History Social History  Substance Use Topics  . Smoking status: Never Smoker  . Smokeless tobacco: Never Used  . Alcohol use Yes     Allergies     Patient has no allergy information on record.   Review of Systems Review of Systems  All other systems reviewed and are negative.    Physical Exam Updated Vital Signs BP (!) 143/75   Pulse 72   Temp 98.8 F (37.1 C) (Oral)   Resp 20   SpO2 98%   Physical Exam  Constitutional: She is oriented to person, place, and time. She appears well-developed and well-nourished. No distress.  HENT:  Head: Atraumatic.  Right Ear: External ear normal.  Left Ear: External ear normal.  Mouth/Throat: Oropharynx is clear and moist.  Eyes: Conjunctivae and EOM are normal. Pupils are equal, round, and reactive to light.  Neck: Neck supple.  No nuchal rigidity  Cardiovascular: Normal rate, regular rhythm and intact distal pulses.   Pulmonary/Chest: Effort normal and breath sounds normal.  Abdominal: Soft. Bowel sounds are normal. She exhibits no distension. There is no tenderness.  Musculoskeletal: Normal range of motion. She exhibits no edema or tenderness.  Neurological: She is alert and oriented to person, place, and time.  Skin: No rash noted.  Psychiatric: She has a normal mood and affect.  Nursing note and vitals reviewed.    ED Treatments / Results  Labs (all labs ordered are listed, but only abnormal results are displayed) Labs Reviewed  BASIC METABOLIC PANEL - Abnormal; Notable for the following:       Result Value   Calcium 10.6 (*)    All other components within normal limits  URINALYSIS, ROUTINE  W REFLEX MICROSCOPIC - Abnormal; Notable for the following:    APPearance CLOUDY (*)    Nitrite POSITIVE (*)    Bacteria, UA FEW (*)    All other components within normal limits  URINE CULTURE  CBC  CBG MONITORING, ED    EKG  EKG Interpretation  Date/Time:  Friday Jan 14 2017 17:37:11 EDT Ventricular Rate:  77 PR Interval:  156 QRS Duration: 74 QT Interval:  370 QTC Calculation: 418 R Axis:   47 Text Interpretation:  Normal sinus rhythm Nonspecific T wave abnormality  Confirmed by Ashok Cordia  MD, Lennette Bihari ([id number]) on 01/14/2017 8:45:09 PM       Radiology No results found.  Procedures Procedures (including critical care time)  Medications Ordered in ED Medications  sodium chloride 0.9 % bolus 1,000 mL (1,000 mLs Intravenous New Bag/Given 01/14/17 2228)  cefTRIAXone (ROCEPHIN) 1 g in dextrose 5 % 50 mL IVPB (1 g Intravenous New Bag/Given 01/14/17 2228)     Initial Impression / Assessment and Plan / ED Course  I have reviewed the triage vital signs and the nursing notes.  Pertinent labs & imaging results that were available during my care of the patient were reviewed by me and considered in my medical decision making (see chart for details).     BP (!) 155/80   Pulse 75   Temp 98.8 F (37.1 C) (Oral)   Resp (!) 24   SpO2 100%    Final Clinical Impressions(s) / ED Diagnoses   Final diagnoses:  Orthostatic hypotension  Acute lower UTI    New Prescriptions New Prescriptions   CEPHALEXIN (KEFLEX) 500 MG CAPSULE    Take 1 capsule (500 mg total) by mouth 3 (three) times daily.   9:26 PM Patient here with chronic fatigue, now with increased lightheadedness. She also endorsed urinary frequency. Her lab is remarkable for a urine that shows nitrite positive concerning for UTI. This finding may contribute to her present complaint. Will treat UTI with Rocephin. Otherwise no evidence of anemia. Her electrolytes panel are reassuring. Her EKG is without any concerning arrhythmia or acute ischemic changes. She is hemodynamically stable. No focal neuro deficit on exam concerning for stroke.  Care discussed with DR. Steinl.   11:01 PM Positive orthostasis on exam.  IVF given.  Pt resting comfortably.  Plan to discharge pt with Keflex, recommend hydration and close follow up with PCP for further care.  Return precaution discussed.  Pt voice understanding and agrees with plan.  She is stable for discharge.     Domenic Moras, PA-C 01/14/17 9147    Lajean Saver,  MD 01/15/17 1535

## 2017-01-16 LAB — URINE CULTURE: Culture: 50000 — AB

## 2017-01-17 ENCOUNTER — Telehealth: Payer: Self-pay | Admitting: Emergency Medicine

## 2017-01-17 NOTE — Telephone Encounter (Signed)
Post ED Visit - Positive Culture Follow-up  Culture report reviewed by antimicrobial stewardship pharmacist:  []  Elenor Quinones, Pharm.D. []  Heide Guile, Pharm.D., BCPS AQ-ID []  Parks Neptune, Pharm.D., BCPS []  Alycia Rossetti, Pharm.D., BCPS []  [place], [place].D., BCPS, AAHIVP []  Legrand Como, Pharm.D., BCPS, AAHIVP [x]  Salome Arnt, PharmD, BCPS []  Dimitri Ped, PharmD, BCPS []  Vincenza Hews, PharmD, BCPS  Positive urine culture Treated with cephalexin, organism sensitive to the same and no further patient follow-up is required at this time.  Hazle Nordmann 01/17/2017, 12:50 PM

## 2017-01-21 DIAGNOSIS — I951 Orthostatic hypotension: Secondary | ICD-10-CM | POA: Diagnosis not present

## 2017-01-24 DIAGNOSIS — H538 Other visual disturbances: Secondary | ICD-10-CM | POA: Diagnosis not present

## 2017-01-24 DIAGNOSIS — H27132 Posterior dislocation of lens, left eye: Secondary | ICD-10-CM | POA: Diagnosis not present

## 2017-01-26 DIAGNOSIS — H33322 Round hole, left eye: Secondary | ICD-10-CM | POA: Diagnosis not present

## 2017-01-26 DIAGNOSIS — H33059 Total retinal detachment, unspecified eye: Secondary | ICD-10-CM | POA: Diagnosis not present

## 2017-01-26 DIAGNOSIS — Z961 Presence of intraocular lens: Secondary | ICD-10-CM | POA: Diagnosis not present

## 2017-01-26 DIAGNOSIS — H43811 Vitreous degeneration, right eye: Secondary | ICD-10-CM | POA: Diagnosis not present

## 2017-01-26 DIAGNOSIS — H35432 Paving stone degeneration of retina, left eye: Secondary | ICD-10-CM | POA: Diagnosis not present

## 2017-01-28 DIAGNOSIS — M47816 Spondylosis without myelopathy or radiculopathy, lumbar region: Secondary | ICD-10-CM | POA: Diagnosis not present

## 2017-01-31 DIAGNOSIS — H33059 Total retinal detachment, unspecified eye: Secondary | ICD-10-CM | POA: Diagnosis not present

## 2017-01-31 DIAGNOSIS — H43811 Vitreous degeneration, right eye: Secondary | ICD-10-CM | POA: Diagnosis not present

## 2017-01-31 DIAGNOSIS — T8522XA Displacement of intraocular lens, initial encounter: Secondary | ICD-10-CM | POA: Diagnosis not present

## 2017-01-31 DIAGNOSIS — H33322 Round hole, left eye: Secondary | ICD-10-CM | POA: Diagnosis not present

## 2017-02-03 DIAGNOSIS — H2702 Aphakia, left eye: Secondary | ICD-10-CM | POA: Diagnosis not present

## 2017-02-03 DIAGNOSIS — T8522XA Displacement of intraocular lens, initial encounter: Secondary | ICD-10-CM | POA: Diagnosis not present

## 2017-02-03 DIAGNOSIS — H43392 Other vitreous opacities, left eye: Secondary | ICD-10-CM | POA: Diagnosis not present

## 2017-02-03 DIAGNOSIS — H4302 Vitreous prolapse, left eye: Secondary | ICD-10-CM | POA: Diagnosis not present

## 2017-02-08 DIAGNOSIS — I951 Orthostatic hypotension: Secondary | ICD-10-CM | POA: Diagnosis not present

## 2017-02-08 DIAGNOSIS — E7800 Pure hypercholesterolemia, unspecified: Secondary | ICD-10-CM | POA: Diagnosis not present

## 2017-02-08 DIAGNOSIS — N390 Urinary tract infection, site not specified: Secondary | ICD-10-CM | POA: Diagnosis not present

## 2017-02-24 DIAGNOSIS — N390 Urinary tract infection, site not specified: Secondary | ICD-10-CM | POA: Diagnosis not present

## 2017-02-25 ENCOUNTER — Ambulatory Visit (INDEPENDENT_AMBULATORY_CARE_PROVIDER_SITE_OTHER): Payer: Medicare Other | Admitting: Cardiovascular Disease

## 2017-02-25 ENCOUNTER — Encounter: Payer: Self-pay | Admitting: Cardiovascular Disease

## 2017-02-25 DIAGNOSIS — E7800 Pure hypercholesterolemia, unspecified: Secondary | ICD-10-CM

## 2017-02-25 DIAGNOSIS — E785 Hyperlipidemia, unspecified: Secondary | ICD-10-CM | POA: Insufficient documentation

## 2017-02-25 DIAGNOSIS — I951 Orthostatic hypotension: Secondary | ICD-10-CM | POA: Insufficient documentation

## 2017-02-25 NOTE — Assessment & Plan Note (Signed)
History of hyperlipidemia on statin therapy followed by her PCP. 

## 2017-02-25 NOTE — Assessment & Plan Note (Signed)
Ms. Jodi Cardenas is being seen today for evaluation of orthostatic hypotension. She has complained of dizziness for the last 3 months occurring primarily when changing positions. She was recently seen in the emergency room was noted noted to be orthostatic and given IV fluids. Her blood pressures today are; lying-148/76, pulse 64, sitting-145/78, pulse 72, standing-111/70, pulse 80, 3 minutes later her blood pressure rose while standing to 123/72. She did complain of some mild dizziness. She is clearly orthostatic. We'll get a 2-D echocardiogram. I suggested that she live liberalize her salt and water intake. I will see her back in 2 months. That time she has not improved we will consider putting her on fludrcotisone.

## 2017-02-25 NOTE — Patient Instructions (Signed)
Medication Instructions: Your physician recommends that you continue on your current medications as directed. Please refer to the Current Medication list given to you today.  Liberalize salt and water intake.    Testing/Procedures: Your physician has requested that you have an echocardiogram. Echocardiography is a painless test that uses sound waves to create images of your heart. It provides your doctor with information about the size and shape of your heart and how well your heart's chambers and valves are working. This procedure takes approximately one hour. There are no restrictions for this procedure.  Follow-Up: Your physician recommends that you schedule a follow-up appointment in: 2 months with Dr. Gwenlyn Found.  If you need a refill on your cardiac medications before your next appointment, please call your pharmacy.

## 2017-02-25 NOTE — Progress Notes (Signed)
02/25/2017 Jodi Cardenas   October 06, 1945  [id number]  Primary Physician Jodi Ruff, MD Primary Cardiologist: Jodi Harp MD Jodi Cardenas  HPI:  Jodi Cardenas is a 71 year old thin appearing married Caucasian female mother of 36, grandmother of one grandchild accompanied by her husband Jodi Cardenas today. She was referred by Dr. Drema Cardenas for cardiovascular evaluation because of orthostatic hypotension. She has a history of hyperlipidemia as well as family history of heart disease with a father who died of a myocardial infarction at age 82. She has never had a heart attack or stroke. She denies chest pain or shortness of breath. She does complain of fatigue over the last year dizziness over the last 3 months primarily when changing positions consistent with orthostasis. She was recently seen in the emergency room on 01/14/17 was noted to be orthostatic and treated with IV fluids. Today in the office she is significantly orthostatic blood pressures that dropped from 140/76 lying to 111/70 standing associated with mild dizziness.   Current Outpatient Prescriptions  Medication Sig Dispense Refill  . alendronate (FOSAMAX) 70 MG tablet     . aspirin 81 MG tablet Take 81 mg by mouth daily.    . Biotin 2500 MCG CAPS Take by mouth 2 (two) times daily.    . cephALEXin (KEFLEX) 500 MG capsule Take 1 capsule (500 mg total) by mouth 3 (three) times daily. 21 capsule 0  . Chlorphen-Pseudoephed-APAP (TYLENOL ALLERGY COMPLETE PO) Take by mouth.    . Coenzyme Q-10 100 MG capsule Take 100 mg by mouth daily.    Marland Kitchen MAGNESIUM GLYCINATE PLUS PO Take by mouth.    . meloxicam (MOBIC) 7.5 MG tablet     . Multiple Vitamin (MULTIVITAMIN) capsule Take 1 capsule by mouth daily.    . Probiotic Product (PROBIOTIC DAILY PO) Take by mouth.    . simvastatin (ZOCOR) 40 MG tablet     . zolpidem (AMBIEN) 10 MG tablet      No current facility-administered medications for this visit.     No Known Allergies  Social  History   Social History  . Marital status: Married    Spouse name: N/A  . Number of children: N/A  . Years of education: N/A   Occupational History  . Not on file.   Social History Main Topics  . Smoking status: Never Smoker  . Smokeless tobacco: Never Used  . Alcohol use Yes  . Drug use: Unknown  . Sexual activity: Not on file   Other Topics Concern  . Not on file   Social History Narrative  . No narrative on file     Review of Systems: General: negative for chills, fever, night sweats or weight changes.  Cardiovascular: negative for chest pain, dyspnea on exertion, edema, orthopnea, palpitations, paroxysmal nocturnal dyspnea or shortness of breath Dermatological: negative for rash Respiratory: negative for cough or wheezing Urologic: negative for hematuria Abdominal: negative for nausea, vomiting, diarrhea, bright red blood per rectum, melena, or hematemesis Neurologic: negative for visual changes, syncope, or dizziness All other systems reviewed and are otherwise negative except as noted above.    Blood pressure (!) 154/78, pulse 65, height 5\' 2"  (1.575 m), weight 117 lb (53.1 kg).  General appearance: alert and no distress Neck: no adenopathy, no carotid bruit, no JVD, supple, symmetrical, trachea midline and thyroid not enlarged, symmetric, no tenderness/mass/nodules Lungs: clear to auscultation bilaterally Heart: regular rate and rhythm, S1, S2 normal, no murmur, click, rub or gallop Extremities: extremities  normal, atraumatic, no cyanosis or edema  EKG sinus rhythm at 65 without ST or T-wave changes. I personally reviewed this EKG.  ASSESSMENT AND PLAN:   Hyperlipidemia History of hyperlipidemia on statin therapy followed by her PCP  Orthostatic hypotension Ms. Jodi Cardenas is being seen today for evaluation of orthostatic hypotension. She has complained of dizziness for the last 3 months occurring primarily when changing positions. She was recently seen in the  emergency room was noted noted to be orthostatic and given IV fluids. Her blood pressures today are; lying-148/76, pulse 64, sitting-145/78, pulse 72, standing-111/70, pulse 80, 3 minutes later her blood pressure rose while standing to 123/72. She did complain of some mild dizziness. She is clearly orthostatic. We'll get a 2-D echocardiogram. I suggested that she live liberalize her salt and water intake. I will see her back in 2 months. That time she has not improved we will consider putting her on fludrcotisone.       Jodi Harp MD [place], [facility] 02/25/2017 4:03 PM

## 2017-03-03 DIAGNOSIS — M47816 Spondylosis without myelopathy or radiculopathy, lumbar region: Secondary | ICD-10-CM | POA: Diagnosis not present

## 2017-03-08 ENCOUNTER — Ambulatory Visit (HOSPITAL_COMMUNITY): Payer: Medicare Other | Attending: Cardiology

## 2017-03-08 ENCOUNTER — Other Ambulatory Visit: Payer: Self-pay

## 2017-03-08 DIAGNOSIS — R350 Frequency of micturition: Secondary | ICD-10-CM | POA: Diagnosis not present

## 2017-03-08 DIAGNOSIS — I951 Orthostatic hypotension: Secondary | ICD-10-CM

## 2017-03-08 DIAGNOSIS — I700 Atherosclerosis of aorta: Secondary | ICD-10-CM | POA: Diagnosis not present

## 2017-03-18 DIAGNOSIS — R531 Weakness: Secondary | ICD-10-CM | POA: Diagnosis not present

## 2017-03-18 DIAGNOSIS — M549 Dorsalgia, unspecified: Secondary | ICD-10-CM | POA: Diagnosis not present

## 2017-03-18 DIAGNOSIS — I951 Orthostatic hypotension: Secondary | ICD-10-CM | POA: Diagnosis not present

## 2017-03-18 DIAGNOSIS — R42 Dizziness and giddiness: Secondary | ICD-10-CM | POA: Diagnosis not present

## 2017-03-18 DIAGNOSIS — R350 Frequency of micturition: Secondary | ICD-10-CM | POA: Diagnosis not present

## 2017-03-21 ENCOUNTER — Telehealth: Payer: Self-pay | Admitting: Cardiovascular Disease

## 2017-03-21 NOTE — Telephone Encounter (Signed)
Received records from [facility] for appointment on 05/04/17 with Dr Gwenlyn Found.  Records put with Dr Kennon Holter schedule for 05/04/17. lp

## 2017-03-25 DIAGNOSIS — M47816 Spondylosis without myelopathy or radiculopathy, lumbar region: Secondary | ICD-10-CM | POA: Diagnosis not present

## 2017-03-30 DIAGNOSIS — I10 Essential (primary) hypertension: Secondary | ICD-10-CM | POA: Diagnosis not present

## 2017-03-30 DIAGNOSIS — I951 Orthostatic hypotension: Secondary | ICD-10-CM | POA: Diagnosis not present

## 2017-04-04 ENCOUNTER — Encounter: Payer: Self-pay | Admitting: Neurology

## 2017-04-04 ENCOUNTER — Ambulatory Visit (INDEPENDENT_AMBULATORY_CARE_PROVIDER_SITE_OTHER): Payer: Medicare Other | Admitting: Neurology

## 2017-04-04 ENCOUNTER — Encounter (INDEPENDENT_AMBULATORY_CARE_PROVIDER_SITE_OTHER): Payer: Self-pay

## 2017-04-04 VITALS — BP 152/82 | HR 71 | Ht 62.0 in | Wt 116.0 lb

## 2017-04-04 DIAGNOSIS — M85851 Other specified disorders of bone density and structure, right thigh: Secondary | ICD-10-CM

## 2017-04-04 DIAGNOSIS — M85852 Other specified disorders of bone density and structure, left thigh: Secondary | ICD-10-CM

## 2017-04-04 DIAGNOSIS — M8580 Other specified disorders of bone density and structure, unspecified site: Secondary | ICD-10-CM | POA: Insufficient documentation

## 2017-04-04 DIAGNOSIS — M545 Low back pain: Secondary | ICD-10-CM

## 2017-04-04 DIAGNOSIS — I951 Orthostatic hypotension: Secondary | ICD-10-CM

## 2017-04-04 DIAGNOSIS — M549 Dorsalgia, unspecified: Secondary | ICD-10-CM

## 2017-04-04 DIAGNOSIS — R5383 Other fatigue: Secondary | ICD-10-CM | POA: Diagnosis not present

## 2017-04-04 DIAGNOSIS — G8929 Other chronic pain: Secondary | ICD-10-CM

## 2017-04-04 DIAGNOSIS — E538 Deficiency of other specified B group vitamins: Secondary | ICD-10-CM | POA: Diagnosis not present

## 2017-04-04 DIAGNOSIS — E7800 Pure hypercholesterolemia, unspecified: Secondary | ICD-10-CM

## 2017-04-04 DIAGNOSIS — R799 Abnormal finding of blood chemistry, unspecified: Secondary | ICD-10-CM | POA: Diagnosis not present

## 2017-04-04 MED ORDER — DULOXETINE HCL 60 MG PO CPEP: 60.0000 mg | ORAL_CAPSULE | Freq: Every day | ORAL | 11 refills | Status: DC

## 2017-04-04 NOTE — Patient Instructions (Signed)
[facility] in [place], [place] Address: [address] [place],  [place] [address]   Phone: [phone]

## 2017-04-04 NOTE — Progress Notes (Signed)
PATIENT: Jodi Cardenas DOB: [birth year]  Chief Complaint  Patient presents with  . Dizziness    Orthostatic Vitals: Lying:152/82, 71, Sitting: 160/80, 77, Standing: 138/78, 80, Standing at 3 minutes: 136/83, 76.  Reports positional dizziness for the last two months.    Marland Kitchen PCP    Leighton Ruff, MD     HISTORICAL  Jodi Cardenas is a 71 years old right-handed female, seen in refer by her primary care doctor  Leighton Ruff, for evaluation of dizziness, initial evaluation was April 04 2017.  I reviewed and summarized the referral note, she had a history of hyperlipidemia, osteopenia,chronic  insomnia, take Ambien10mg  prn. History of right eyelid retinal detachment.  I was able to review her ER presentation on Jan 14 2017, for evaluation of generalized fatigue and dizziness, not feeling well, recurrent lightheadedness and dizziness, there was documented orthostatic blood pressure changes, there was also evidence of UTI, her symptoms has much improved with IV steroid.  She had a history of kyphosis, scoliosis,progressive worsening neck to lower back pain, limited mobility, since May 2018, she began to notice dizziness, she only notices dizziness when she is up about, standing for few minutes, she felt lightheadedness, whoozing sensation, no vertigo, she denies similar dizziness in the sitting down or lying down position  Laboratory evaluations in July 2018, normal CMP, creatinine 0.77, normal TSH 1.90 CBC, hemoglobin of 13.6, triglyceride 96, triglycerides right148, LDL 61.  REVIEW OF SYSTEMS: Full 14 system review of systems performed and notable only for constipation, joint pain, urination problems, weakness, dizziness, insomnia  ALLERGIES: No Known Allergies  HOME MEDICATIONS: Current Outpatient Prescriptions  Medication Sig Dispense Refill  . alendronate (FOSAMAX) 70 MG tablet     . aspirin 81 MG tablet Take 81 mg by mouth daily.    . Biotin 2500 MCG CAPS Take by mouth 2  (two) times daily.    . Chlorphen-Pseudoephed-APAP (TYLENOL ALLERGY COMPLETE PO) Take by mouth.    . Coenzyme Q-10 100 MG capsule Take 100 mg by mouth daily.    . meloxicam (MOBIC) 7.5 MG tablet     . Multiple Vitamin (MULTIVITAMIN) capsule Take 1 capsule by mouth daily.    . Probiotic Product (PROBIOTIC DAILY PO) Take by mouth.    . simvastatin (ZOCOR) 20 MG tablet Take 20 mg by mouth daily.    Marland Kitchen zolpidem (AMBIEN) 10 MG tablet      No current facility-administered medications for this visit.     PAST MEDICAL HISTORY: Past Medical History:  Diagnosis Date  . Dizziness   . Hypercholesteremia   . Osteopenia   . Scoliosis     PAST SURGICAL HISTORY: Past Surgical History:  Procedure Laterality Date  . EYE SURGERY     lens Implants x 2  . RETINAL DETACHMENT SURGERY    . TONSILLECTOMY    . TUBAL LIGATION    . WRIST SURGERY Right     FAMILY HISTORY: Family History  Problem Relation Age of Onset  . Heart disease Father     SOCIAL HISTORY:  Social History   Social History  . Marital status: Married    Spouse name: N/A  . Number of children: 3  . Years of education: College   Occupational History  . Retired Therapist, sports    Social History Main Topics  . Smoking status: Never Smoker  . Smokeless tobacco: Never Used  . Alcohol use Yes     Comment: occasional wine  . Drug use: No  . Sexual  activity: Not on file   Other Topics Concern  . Not on file   Social History Narrative   Lives at home with husband.   Right-handed.   Two cups coffee per day.     PHYSICAL EXAM   Vitals:   04/04/17 1001  BP: (!) 152/82  Pulse: 71  Weight: 116 lb (52.6 kg)  Height: 5\' 2"  (1.575 m)    Not recorded      Body mass index is 21.22 kg/m.  PHYSICAL EXAMNIATION:  Gen: NAD, conversant, well nourised, obese, well groomed                     Cardiovascular: Regular rate rhythm, no peripheral edema, warm, nontender. Eyes: Conjunctivae clear without exudates or hemorrhage Neck:  Supple, no carotid bruits. Pulmonary: Clear to auscultation bilaterally   NEUROLOGICAL EXAM:  MENTAL STATUS: Speech:    Speech is normal; fluent and spontaneous with normal comprehension.  Cognition:     Orientation to time, place and person     Normal recent and remote memory     Normal Attention span and concentration     Normal Language, naming, repeating,spontaneous speech     Fund of knowledge   CRANIAL NERVES: CN II: Visual fields are full to confrontation. Fundoscopic exam is normal with sharp discs and no vascular changes. Pupils are round equal and briskly reactive to light. CN III, IV, VI: extraocular movement are normal. No ptosis. CN V: Facial sensation is intact to pinprick in all 3 divisions bilaterally. Corneal responses are intact.  CN VII: Face is symmetric with normal eye closure and smile. CN VIII: Hearing is normal to rubbing fingers CN IX, X: Palate elevates symmetrically. Phonation is normal. CN XI: Head turning and shoulder shrug are intact CN XII: Tongue is midline with normal movements and no atrophy.  MOTOR: There is no pronator drift of out-stretched arms. Muscle bulk and tone are normal. Muscle strength is normal.  REFLEXES: Reflexes are 2+ and symmetric at the biceps, triceps, knees, and ankles. Plantar responses are flexor.  SENSORY: length dependent decreased to light touch, pinprick and the vibratory sensation at toes, distal leg  COORDINATION: Rapid alternating movements and fine finger movements are intact. There is no dysmetria on finger-to-nose and heel-knee-shin.    GAIT/STANCE: She needs push up to get up from seated position, mildly unsteady, kyphosis   DIAGNOSTIC DATA (LABS, IMAGING, TESTING) - I reviewed patient records, labs, notes, testing and imaging myself where available.   ASSESSMENT AND PLAN  Jodi Cardenas is a 71 y.o. female   Orthostatic dizziness Evidence of length dependent sensory changes  Suggestive of peripheral  neuropathy  Laboratory evaluation for etiology  EMG nerve conduction study   Jodi Cardenas, M.D. Ph.D.  [facility] [address], [place], [place] [address] Ph: [phone] Fax: [phone]  CC: Leighton Ruff, MD

## 2017-04-06 LAB — MULTIPLE MYELOMA PANEL, SERUM
ALBUMIN/GLOB SERPL: 1.4 (ref 0.7–1.7)
Albumin SerPl Elph-Mcnc: 3.9 g/dL (ref 2.9–4.4)
Alpha 1: 0.2 g/dL (ref 0.0–0.4)
Alpha2 Glob SerPl Elph-Mcnc: 0.8 g/dL (ref 0.4–1.0)
B-Globulin SerPl Elph-Mcnc: 1 g/dL (ref 0.7–1.3)
GAMMA GLOB SERPL ELPH-MCNC: 1 g/dL (ref 0.4–1.8)
GLOBULIN, TOTAL: 3 g/dL (ref 2.2–3.9)
IGA/IMMUNOGLOBULIN A, SERUM: 255 mg/dL (ref 64–422)
IGM (IMMUNOGLOBULIN M), SRM: 75 mg/dL (ref 26–217)
IgG (Immunoglobin G), Serum: 929 mg/dL (ref 700–1600)
Total Protein: 6.9 g/dL (ref 6.0–8.5)

## 2017-04-06 LAB — CK: CK TOTAL: 54 U/L (ref 24–173)

## 2017-04-06 LAB — ANA W/REFLEX IF POSITIVE: Anti Nuclear Antibody(ANA): NEGATIVE

## 2017-04-06 LAB — HEMOGLOBIN A1C
Est. average glucose Bld gHb Est-mCnc: 108 mg/dL
Hgb A1c MFr Bld: 5.4 % (ref 4.8–5.6)

## 2017-04-06 LAB — VITAMIN B1: Thiamine: 162.3 nmol/L (ref 66.5–200.0)

## 2017-04-06 LAB — SEDIMENTATION RATE: SED RATE: 2 mm/h (ref 0–40)

## 2017-04-06 LAB — C-REACTIVE PROTEIN: CRP: 0.4 mg/L (ref 0.0–4.9)

## 2017-04-06 LAB — VITAMIN D 25 HYDROXY (VIT D DEFICIENCY, FRACTURES): VIT D 25 HYDROXY: 52.9 ng/mL (ref 30.0–100.0)

## 2017-04-06 LAB — THYROID PANEL WITH TSH
FREE THYROXINE INDEX: 2.1 (ref 1.2–4.9)
T3 Uptake Ratio: 29 % (ref 24–39)
T4 TOTAL: 7.2 ug/dL (ref 4.5–12.0)
TSH: 2.53 u[IU]/mL (ref 0.450–4.500)

## 2017-04-06 LAB — VITAMIN B12: Vitamin B-12: 1769 pg/mL — ABNORMAL HIGH (ref 232–1245)

## 2017-04-06 LAB — COPPER, SERUM: COPPER: 95 ug/dL (ref 72–166)

## 2017-04-07 ENCOUNTER — Telehealth: Payer: Self-pay | Admitting: Cardiovascular Disease

## 2017-04-07 DIAGNOSIS — M47816 Spondylosis without myelopathy or radiculopathy, lumbar region: Secondary | ICD-10-CM | POA: Diagnosis not present

## 2017-04-07 NOTE — Telephone Encounter (Signed)
Pt says her blood pressure is dropping 30 points lower when she stands up and she gets dizzy. Pt would like to be seen asap please.

## 2017-04-07 NOTE — Telephone Encounter (Signed)
Returned call to patient, patient reports she has continued to experience orthostatic hypotension since seeing Dr. Evangeline Cardenas.  It has not improved.   Reports she took her BP and sitting it was 130/70 and standing it was 100/60, reports feeling dizzy, doesn't feel safe to walk.   Reports she is trying to stay hydrated and liberalize he salt intake.      Last OV with Dr. Gwenlyn Cardenas: Orthostatic hypotension Ms. Jodi Cardenas is being seen today for evaluation of orthostatic hypotension. She has complained of dizziness for the last 3 months occurring primarily when changing positions. She was recently seen in the emergency room was noted noted to be orthostatic and given IV fluids. Her blood pressures today are; lying-148/76, pulse 64, sitting-145/78, pulse 72, standing-111/70, pulse 80, 3 minutes later her blood pressure rose while standing to 123/72. She did complain of some mild dizziness. She is clearly orthostatic. We'll get a 2-D echocardiogram. I suggested that she live liberalize her salt and water intake. I will see her back in 2 months. That time she has not improved we will consider putting her on fludrcotisone.    Echo 7/17-normal   Advised I would route message to Dr. Gwenlyn Cardenas for review.  Patient aware and verbalized understanding.

## 2017-04-08 NOTE — Telephone Encounter (Signed)
Have her follow-up with the mid-level provider

## 2017-04-20 NOTE — Telephone Encounter (Signed)
Message sent to scheduling to arrange this appointment.

## 2017-04-22 ENCOUNTER — Ambulatory Visit (INDEPENDENT_AMBULATORY_CARE_PROVIDER_SITE_OTHER): Payer: Medicare Other | Admitting: Neurology

## 2017-04-22 DIAGNOSIS — G8929 Other chronic pain: Secondary | ICD-10-CM

## 2017-04-22 DIAGNOSIS — M545 Low back pain: Secondary | ICD-10-CM | POA: Diagnosis not present

## 2017-04-22 DIAGNOSIS — I951 Orthostatic hypotension: Secondary | ICD-10-CM

## 2017-04-22 DIAGNOSIS — E7800 Pure hypercholesterolemia, unspecified: Secondary | ICD-10-CM

## 2017-04-22 MED ORDER — PYRIDOSTIGMINE BROMIDE 60 MG PO TABS: 60.0000 mg | ORAL_TABLET | Freq: Three times a day (TID) | ORAL | 6 refills | Status: DC

## 2017-04-22 NOTE — Progress Notes (Signed)
PATIENT: Jodi Cardenas DOB: 12-05-1945  No chief complaint on file.    HISTORICAL  Jodi Cardenas is a 71 years old right-handed female, seen in refer by her primary care doctor  Leighton Ruff, for evaluation of dizziness, initial evaluation was April 04 2017.  I reviewed and summarized the referral note, she had a history of hyperlipidemia, osteopenia,chronic  insomnia, take Ambien44m prn. History of right eyelid retinal detachment.  I was able to review her ER presentation on Jan 14 2017, for evaluation of generalized fatigue and dizziness, not feeling well, recurrent lightheadedness and dizziness, there was documented orthostatic blood pressure changes, there was also evidence of UTI, her symptoms has much improved with IV steroid.  She had a history of kyphosis, scoliosis,progressive worsening neck to lower back pain, limited mobility, since May 2018, she began to notice dizziness, she only notices dizziness when she is up about, standing for few minutes, she felt lightheadedness, whoozing sensation, no vertigo, she denies similar dizziness in the sitting down or lying down position  Laboratory evaluations in July 2018, normal CMP, creatinine 0.77, normal TSH 1.90 CBC, hemoglobin of 13.6, triglyceride 96, triglycerides right148, LDL 61.  UPDATE April 22 2017: Laboratory evaluation showed normal ESR C-reactive protein, protein electrophoresis, ANA, copper, vitamin B1, D, CPK, thyroid functional tasks, B12, A1c was 5.4.  Today's electrodiagnostic studies normal, there is no evidence of peripheral neuropathy, she tried Cymbalta 60 mg daily, did not help her symptoms, she continue complaining significant multiple joints pain, shoulder pain, chronic low back pain, dizziness when getting up quickly, husband reported a positive orthostatic blood pressure changes.   REVIEW OF SYSTEMS: Full 14 system review of systems performed and notable only for constipation, joint pain, urination  problems, weakness, dizziness, insomnia  ALLERGIES: No Known Allergies  HOME MEDICATIONS: Current Outpatient Prescriptions  Medication Sig Dispense Refill  . alendronate (FOSAMAX) 70 MG tablet     . aspirin 81 MG tablet Take 81 mg by mouth daily.    . Biotin 2500 MCG CAPS Take by mouth 2 (two) times daily.    . Chlorphen-Pseudoephed-APAP (TYLENOL ALLERGY COMPLETE PO) Take by mouth.    . Coenzyme Q-10 100 MG capsule Take 100 mg by mouth daily.    . DULoxetine (CYMBALTA) 60 MG capsule Take 1 capsule (60 mg total) by mouth daily. 30 capsule 11  . meloxicam (MOBIC) 7.5 MG tablet     . Multiple Vitamin (MULTIVITAMIN) capsule Take 1 capsule by mouth daily.    . Probiotic Product (PROBIOTIC DAILY PO) Take by mouth.    . simvastatin (ZOCOR) 20 MG tablet Take 20 mg by mouth daily.    .Marland Kitchenzolpidem (AMBIEN) 10 MG tablet      No current facility-administered medications for this visit.     PAST MEDICAL HISTORY: Past Medical History:  Diagnosis Date  . Dizziness   . Hypercholesteremia   . Osteopenia   . Scoliosis     PAST SURGICAL HISTORY: Past Surgical History:  Procedure Laterality Date  . EYE SURGERY     lens Implants x 2  . RETINAL DETACHMENT SURGERY    . TONSILLECTOMY    . TUBAL LIGATION    . WRIST SURGERY Right     FAMILY HISTORY: Family History  Problem Relation Age of Onset  . Heart disease Father     SOCIAL HISTORY:  Social History   Social History  . Marital status: Married    Spouse name: N/A  . Number of children: 3  .  Years of education: College   Occupational History  . Retired Therapist, sports    Social History Main Topics  . Smoking status: Never Smoker  . Smokeless tobacco: Never Used  . Alcohol use Yes     Comment: occasional wine  . Drug use: No  . Sexual activity: Not on file   Other Topics Concern  . Not on file   Social History Narrative   Lives at home with husband.   Right-handed.   Two cups coffee per day.     PHYSICAL EXAM   There were no  vitals filed for this visit.  Not recorded      There is no height or weight on file to calculate BMI.  PHYSICAL EXAMNIATION:  Gen: NAD, conversant, well nourised, obese, well groomed                     Cardiovascular: Regular rate rhythm, no peripheral edema, warm, nontender. Eyes: Conjunctivae clear without exudates or hemorrhage Neck: Supple, no carotid bruits. Pulmonary: Clear to auscultation bilaterally   NEUROLOGICAL EXAM:  MENTAL STATUS: Speech:    Speech is normal; fluent and spontaneous with normal comprehension.  Cognition:     Orientation to time, place and person     Normal recent and remote memory     Normal Attention span and concentration     Normal Language, naming, repeating,spontaneous speech     Fund of knowledge   CRANIAL NERVES: CN II: Visual fields are full to confrontation. Fundoscopic exam is normal with sharp discs and no vascular changes. Pupils are round equal and briskly reactive to light. CN III, IV, VI: extraocular movement are normal. No ptosis. CN V: Facial sensation is intact to pinprick in all 3 divisions bilaterally. Corneal responses are intact.  CN VII: Face is symmetric with normal eye closure and smile. CN VIII: Hearing is normal to rubbing fingers CN IX, X: Palate elevates symmetrically. Phonation is normal. CN XI: Head turning and shoulder shrug are intact CN XII: Tongue is midline with normal movements and no atrophy.  MOTOR: There is no pronator drift of out-stretched arms. Muscle bulk and tone are normal. Muscle strength is normal.  REFLEXES: Reflexes are 2+ and symmetric at the biceps, triceps, knees, and ankles. Plantar responses are flexor.  SENSORY: length dependent decreased to light touch, pinprick and the vibratory sensation at toes, distal leg  COORDINATION: Rapid alternating movements and fine finger movements are intact. There is no dysmetria on finger-to-nose and heel-knee-shin.    GAIT/STANCE: She needs push up  to get up from seated position, mildly unsteady, kyphosis   DIAGNOSTIC DATA (LABS, IMAGING, TESTING) - I reviewed patient records, labs, notes, testing and imaging myself where available.   ASSESSMENT AND PLAN  Jodi Cardenas is a 71 y.o. female   Orthostatic dizziness  There is no evidence of large fiber peripheral neuropathy or other central nervous system degenerative disorder based on current evaluations  Extensive laboratory evaluation failed to demonstrate etiology  I have advised her continue to be active, increase water intake, give her trial of Mestinon 60 mg 3 times a day  Continue cardiology follow-up  Return to clinic for worsening symptoms   Marcial Pacas, M.D. Ph.D.  [facility] [address] [place], [place] [address] Ph: [phone] Fax: [phone]  CC: Leighton Ruff, MD

## 2017-04-22 NOTE — Procedures (Signed)
Full Name: Avo Schlachter Gender: Female MRN #: [id number] Date of Birth: September 03, 1945    Visit Date: 04/22/2017 11:17 Age: 71 Years 88 Months Old Examining Physician: Marcial Pacas, MD  Referring Physician: Krista Blue, MD History: 71 years old female, with bilateral lower extremity weakness, orthostatic blood pressure changes  Summary of the test: Nerve conduction study: Bilateral sural, superficial peroneal sensory responses were normal. Bilateral peroneal to EDB and tibial motor responses were normal.  Electromyography: Selective needle examination of right lower extremity muscles and right lumbosacral paraspinal muscles were normal.  Conclusion: This is a normal study. There is no electrodiagnostic evidence of large fiber peripheral neuropathy or right lumbosacral radiculopathy.    ------------------------------- Marcial Pacas, M.D. Ph.D.  [facility] [place], [place] [address] Tel: [phone] Fax: [phone]        [facility]    Nerve / Sites Muscle Latency Ref. Amplitude Ref. Rel Amp Segments Distance Velocity Ref. Area    ms ms mV mV %  cm m/s m/s mVms  R Peroneal - EDB     Ankle EDB 4.7 ?6.5 3.8 ?2.0 100 Ankle - EDB 9   15.3     Fib head EDB 10.9  3.4  87.6 Fib head - Ankle 28 45 ?44 13.7     Pop fossa EDB 13.0  3.9  116 Pop fossa - Fib head 10 48 ?44 14.6         Pop fossa - Ankle      L Peroneal - EDB     Ankle EDB 5.0 ?6.5 4.9 ?2.0 100 Ankle - EDB 9   20.4     Fib head EDB 11.4  4.4  90.5 Fib head - Ankle 28 44 ?44 19.6     Pop fossa EDB 13.6  4.5  101 Pop fossa - Fib head 10 45 ?44 19.4         Pop fossa - Ankle      R Tibial - AH     Ankle AH 5.0 ?5.8 5.2 ?4.0 100 Ankle - AH 9   16.0     Pop fossa AH 13.4  4.7  91.2 Pop fossa - Ankle 36 43 ?41 16.8  L Tibial - AH     Ankle AH 5.1 ?5.8 7.9 ?4.0 100 Ankle - AH 9   30.1     Pop fossa AH 14.2  6.6  82.8 Pop fossa - Ankle 37 41 ?41 24.3             SNC    Nerve / Sites Rec. Site Peak  Lat Ref.  Amp Ref. Segments Distance    ms ms V V  cm  R Sural - Ankle (Calf)     Calf Ankle 3.7 ?4.4 17 ?6 Calf - Ankle 14  L Sural - Ankle (Calf)     Calf Ankle 4.1 ?4.4 18 ?6 Calf - Ankle 14  R Superficial peroneal - Ankle     Lat leg Ankle 3.9 ?4.4 8 ?6 Lat leg - Ankle 14  L Superficial peroneal - Ankle     Lat leg Ankle 3.8 ?4.4 14 ?6 Lat leg - Ankle 14             F  Wave    Nerve F Lat Ref.   ms ms  R Tibial - AH 50.3 ?56.0  L Tibial - AH 49.4 ?56.0         H  Reflex    Nerve H Lat Lat Hmax   ms ms   Left Right Ref. Left Right Ref.  Tibial - Soleus 30.0 30.3 ?35.0 30.5 30.3 ?35.0         EMG full       EMG Summary Table    Spontaneous MUAP Recruitment  Muscle IA Fib PSW Fasc Other Amp Dur. Poly Pattern  R. Tibialis anterior Normal None None None _______ Normal Normal Normal Normal  R. Abductor hallucis Normal None None None _______ Normal Normal Normal Normal  R. Vastus lateralis Normal None None None _______ Normal Normal Normal Normal  R. Tibialis posterior Normal None None None _______ Normal Normal Normal Normal  R. Lumbar paraspinals (mid) Normal None None None _______ Normal Normal Normal Normal  R. Lumbar paraspinals (low Normal None None None _______ Normal Normal Normal Normal

## 2017-04-27 NOTE — Telephone Encounter (Signed)
Appt scheduled with Dr. Gwenlyn Found on 05/04/17.

## 2017-04-29 DIAGNOSIS — M25512 Pain in left shoulder: Secondary | ICD-10-CM | POA: Diagnosis not present

## 2017-04-29 DIAGNOSIS — M47816 Spondylosis without myelopathy or radiculopathy, lumbar region: Secondary | ICD-10-CM | POA: Diagnosis not present

## 2017-05-04 ENCOUNTER — Ambulatory Visit (INDEPENDENT_AMBULATORY_CARE_PROVIDER_SITE_OTHER): Payer: Medicare Other | Admitting: Cardiovascular Disease

## 2017-05-04 ENCOUNTER — Encounter: Payer: Self-pay | Admitting: Cardiovascular Disease

## 2017-05-04 VITALS — BP 141/79 | HR 76 | Ht 62.5 in | Wt 111.2 lb

## 2017-05-04 DIAGNOSIS — I951 Orthostatic hypotension: Secondary | ICD-10-CM

## 2017-05-04 MED ORDER — MIDODRINE HCL 2.5 MG PO TABS: 2.5000 mg | ORAL_TABLET | Freq: Three times a day (TID) | ORAL | 3 refills | Status: DC

## 2017-05-04 NOTE — Patient Instructions (Signed)
Medication Instructions: START Midodrine 2.5 mg three times daily with meals.  Start weaning yourself off of Ambien.   Follow-Up: Your physician recommends that you schedule a follow-up appointment in: 1 month with PharmD for medication titration. Your physician has requested that you regularly monitor and record your blood pressure readings at home. Please use the same machine at the same time of day to check your readings and record them to bring to your follow-up visit.  Your physician recommends that you schedule a follow-up appointment in: 3 months with Dr. Gwenlyn Found.  If you need a refill on your cardiac medications before your next appointment, please call your pharmacy.

## 2017-05-04 NOTE — Progress Notes (Signed)
05/04/2017 Jodi Cardenas   10-21-1945  [id number]  Primary Physician Leighton Ruff, MD Primary Cardiologist: Lorretta Harp MD Jodi Cardenas, [place]  HPI:  Jodi Cardenas is a 71 y.o. female  thin appearing married Caucasian female mother of 78, grandmother of one grandchild accompanied by her husband Jodi Cardenas today. I last saw her in the office 02/25/17. She was referred by Dr. Drema Dallas for cardiovascular evaluation because of orthostatic hypotension. She has a history of hyperlipidemia as well as family history of heart disease with a father who died of a myocardial infarction at age 71. She has never had a heart attack or stroke. She denies chest pain or shortness of breath. She does complain of fatigue over the last year dizziness over the last 3 months primarily when changing positions consistent with orthostasis. She was recently seen in the emergency room on 01/14/17 was noted to be orthostatic and treated with IV fluids. Today in the office she is significantly orthostatic blood pressures that dropped from 140/76 lying to 111/70 standing associated with mild dizziness. I recommended liberalization of her salt intake 2 months ago which has not improved her symptoms or blood pressure readings. She doesn't does take Ambien on a daily basis which may be contributory.    Current Meds  Medication Sig  . alendronate (FOSAMAX) 70 MG tablet   . aspirin 81 MG tablet Take 81 mg by mouth daily.  . Biotin 2500 MCG CAPS Take by mouth 2 (two) times daily.  . Coenzyme Q-10 100 MG capsule Take 100 mg by mouth daily.  . meloxicam (MOBIC) 7.5 MG tablet   . Multiple Vitamin (MULTIVITAMIN) capsule Take 1 capsule by mouth daily.  . Probiotic Product (PROBIOTIC DAILY PO) Take by mouth.  . Pseudoeph-CPM-DM-APAP (TYLENOL COLD PO) Take by mouth.  . simvastatin (ZOCOR) 20 MG tablet Take 20 mg by mouth daily.  Marland Kitchen zolpidem (AMBIEN) 10 MG tablet   . [DISCONTINUED] Chlorphen-Pseudoephed-APAP (TYLENOL ALLERGY  COMPLETE PO) Take by mouth.  . [DISCONTINUED] pyridostigmine (MESTINON) 60 MG tablet Take 1 tablet (60 mg total) by mouth 3 (three) times daily.     No Known Allergies  Social History   Social History  . Marital status: Married    Spouse name: N/A  . Number of children: 3  . Years of education: College   Occupational History  . Retired Therapist, sports    Social History Main Topics  . Smoking status: Never Smoker  . Smokeless tobacco: Never Used  . Alcohol use Yes     Comment: occasional wine  . Drug use: No  . Sexual activity: Not on file   Other Topics Concern  . Not on file   Social History Narrative   Lives at home with husband.   Right-handed.   Two cups coffee per day.     Review of Systems: General: negative for chills, fever, night sweats or weight changes.  Cardiovascular: negative for chest pain, dyspnea on exertion, edema, orthopnea, palpitations, paroxysmal nocturnal dyspnea or shortness of breath Dermatological: negative for rash Respiratory: negative for cough or wheezing Urologic: negative for hematuria Abdominal: negative for nausea, vomiting, diarrhea, bright red blood per rectum, melena, or hematemesis Neurologic: negative for visual changes, syncope, or dizziness All other systems reviewed and are otherwise negative except as noted above.    Blood pressure (!) 141/79, pulse 76, height 5' 2.5" (1.588 m), weight 111 lb 3.2 oz (50.4 kg).  General appearance: alert and no distress Neck: no adenopathy, no carotid  bruit, no JVD, supple, symmetrical, trachea midline and thyroid not enlarged, symmetric, no tenderness/mass/nodules Lungs: clear to auscultation bilaterally Heart: regular rate and rhythm, S1, S2 normal, no murmur, click, rub or gallop Extremities: extremities normal, atraumatic, no cyanosis or edema  EKG not performed today  ASSESSMENT AND PLAN:   Orthostatic hypotension Ms. Pankonin continues to have symptomatic orthostatic hypotension. She did live  to liberalize her salt intake when I saw her several months ago without improvement. Her orthostatic blood pressures today were remarkable for a low supine blood pressure of 155/78 with a pulse of 76 and a standing blood pressure 110/73 with a pulse of 97. I'm going to begin her on low-dose Midodrin 2.5 mg by mouth 3 times a day. She'll keep it a blood pressure log and will see Erasmo Downer back in one month for Midodrin titration up to a maximum 10 mg by mouth 3 times a day. I'll see her back in 3 months.      Lorretta Harp MD FACP,FACC,FAHA, [facility] 05/04/2017 12:41 PM

## 2017-05-04 NOTE — Assessment & Plan Note (Addendum)
Jodi Cardenas continues to have symptomatic orthostatic hypotension. She did live to liberalize her salt intake when I saw her several months ago without improvement. Her orthostatic blood pressures today were remarkable for a low supine blood pressure of 155/78 with a pulse of 76 and a standing blood pressure 110/73 with a pulse of 97. I'm going to begin her on low-dose Midodrin 2.5 mg by mouth 3 times a day. She'll keep it a blood pressure log and will see Erasmo Downer back in one month for Midodrin titration up to a maximum 10 mg by mouth 3 times a day. I'll see her back in 3 months.

## 2017-05-06 ENCOUNTER — Telehealth: Payer: Self-pay | Admitting: Cardiovascular Disease

## 2017-05-06 NOTE — Telephone Encounter (Signed)
Pt c/o medication issue:  1. Name of Medication: Midodrine 2.5 mg  2. How are you currently taking this medication (dosage and times per day)? 1 tid  3. Are you having a reaction (difficulty breathing--STAT)? no  4. What is your medication issue? Side effects are worrying her, asked if she was having issues- not at this time her main concern is she has back issues and she states she isn't suppose to lie down while taking this med??

## 2017-05-06 NOTE — Telephone Encounter (Signed)
Returned call to patient.  She has scoliosis and chronic back pain that leave her somewhat incapacitated.  She will often have to lay down for an hour or so before it subsides.  Currently taking meloxicam 7.5 mg daily, but admits it does not help with any significance.  Blood pressure laying down is still up to [id number] systolic.  Had a long conversation with patient.  She is agreeable to stopping meloxicam for now and using acetaminophen 1.2 gm bid to see if any improvement in pain level.  She will also try harder to increase fluids and added salt, as she admits she hasn't made any changes.   Will have her use midodrine 2.5 mg tid, but if her pressure is already high or if she knows she needs to be laying down for awhile, she will hold doses.     Will have her keep appt set for October, but she knows to call with any other concerns.

## 2017-05-06 NOTE — Telephone Encounter (Signed)
Returned call to patient.She stated she just started taking Midodrine 2.5 mg three times a day this past Wed 9/12.Stated she has back pain and she has to lay down.Stated directions say after taking Midodrine you cannot lay down for at least 4 hours.She wants to know what to do.Advised I will send message to pharmacy for advice.

## 2017-05-20 DIAGNOSIS — Z23 Encounter for immunization: Secondary | ICD-10-CM | POA: Diagnosis not present

## 2017-05-20 DIAGNOSIS — R531 Weakness: Secondary | ICD-10-CM | POA: Diagnosis not present

## 2017-05-20 DIAGNOSIS — Z79899 Other long term (current) drug therapy: Secondary | ICD-10-CM | POA: Diagnosis not present

## 2017-05-20 DIAGNOSIS — M549 Dorsalgia, unspecified: Secondary | ICD-10-CM | POA: Diagnosis not present

## 2017-05-20 DIAGNOSIS — E7800 Pure hypercholesterolemia, unspecified: Secondary | ICD-10-CM | POA: Diagnosis not present

## 2017-05-20 DIAGNOSIS — R350 Frequency of micturition: Secondary | ICD-10-CM | POA: Diagnosis not present

## 2017-05-20 DIAGNOSIS — G479 Sleep disorder, unspecified: Secondary | ICD-10-CM | POA: Diagnosis not present

## 2017-05-20 DIAGNOSIS — I951 Orthostatic hypotension: Secondary | ICD-10-CM | POA: Diagnosis not present

## 2017-05-30 DIAGNOSIS — M47816 Spondylosis without myelopathy or radiculopathy, lumbar region: Secondary | ICD-10-CM | POA: Diagnosis not present

## 2017-06-08 ENCOUNTER — Ambulatory Visit (INDEPENDENT_AMBULATORY_CARE_PROVIDER_SITE_OTHER): Payer: Medicare Other | Admitting: Pharmacist

## 2017-06-08 VITALS — BP 80/56 | HR 70

## 2017-06-08 DIAGNOSIS — I951 Orthostatic hypotension: Secondary | ICD-10-CM | POA: Diagnosis not present

## 2017-06-08 NOTE — Patient Instructions (Addendum)
Return for a  follow up appointment in 4 weeks  Your blood pressure today is 102/60 pulse 70  Check your blood pressure at home daily (if able) and keep record of the readings.  Take your BP meds as follows: *TAKE MIDRODRINE 2.5mg  as needed and avoid laying down for 3- 4 hours after taking dose* *Start using compression stockings* *Continue all other non-pharmacological therapy*   *Consider Ramelteon for insomnia - *     HOW TO TAKE YOUR BLOOD PRESSURE: . Rest 5 minutes before taking your blood pressure. .  Don't smoke or drink caffeinated beverages for at least 30 minutes before. . Take your blood pressure before (not after) you eat. . Sit comfortably with your back supported and both feet on the floor (don't cross your legs). . Elevate your arm to heart level on a table or a desk. . Use the proper sized cuff. It should fit smoothly and snugly around your bare upper arm. There should be enough room to slip a fingertip under the cuff. The bottom edge of the cuff should be 1 inch above the crease of the elbow. . Ideally, take 3 measurements at one sitting and record the average.

## 2017-06-08 NOTE — Progress Notes (Signed)
Patient ID: Jodi Cardenas                 DOB: [birth year]                      MRN: [id number]     HPI: Jodi Cardenas is a 71 y.o. female referred by Dr. Gwenlyn Found to HTN clinic. PMH includes hyperlipidemia, chronic back pain and symptomatic hypotension. During most recent office visit, on 05/04/2017 , Midodrine 2.27m three tmes daily was added to her therapy. Today patient presents to HTN clinic for evaulation and medication titration as needed. Patient denies increased fatigue or dizziness. Last syncopal episode was > 1 months ago.  Jodi Cardenas comes to clinica accompany by her husband and reports only taking midodrine for 5 days, then stopped taking due to lack response and fear of elevated BP when lying down. She also stopped taking Ambien and Zocor after husband Chief Financial Officer) read potential side effects of medication.  Current hypotension meds:  Midodrine 2.5mg  TID while awake and <4hrs from bedtime (not taking)  Social History: denies tobacco or alcohol use  Diet: liberalize salt intake, mostly home cooked meals  Exercise: minimal due to chronic back pain  Home BP readings:  57 readings supine; average 152/77 (pulse 78 bpm) 57 readings sitting; average 125/71 (pulse 90 bpm) 57 readings standing; average 95/58 (pulse 102 bpm)  Wt Readings from Last 3 Encounters:  05/04/17 111 lb 3.2 oz (50.4 kg)  04/04/17 116 lb (52.6 kg)  02/25/17 117 lb (53.1 kg)   BP Readings from Last 3 Encounters:  06/08/17 (!) 80/56  05/04/17 (!) 141/79  04/04/17 (!) 152/82   Pulse Readings from Last 3 Encounters:  06/08/17 70  05/04/17 76  04/04/17 71    Renal function: CrCl cannot be calculated (Patient's most recent lab result is older than the maximum 21 days allowed.).  Past Medical History:  Diagnosis Date  . Dizziness   . Hypercholesteremia   . Osteopenia   . Scoliosis     Current Outpatient Prescriptions on File Prior to Visit  Medication Sig Dispense Refill  . alendronate (FOSAMAX)  70 MG tablet     . aspirin 81 MG tablet Take 81 mg by mouth daily.    . Biotin 2500 MCG CAPS Take by mouth 2 (two) times daily.    . Coenzyme Q-10 100 MG capsule Take 100 mg by mouth daily.    . meloxicam (MOBIC) 7.5 MG tablet     . midodrine (PROAMATINE) 2.5 MG tablet Take 1 tablet (2.5 mg total) by mouth 3 (three) times daily with meals. 90 tablet 3  . Multiple Vitamin (MULTIVITAMIN) capsule Take 1 capsule by mouth daily.    . Probiotic Product (PROBIOTIC DAILY PO) Take by mouth.    . Pseudoeph-CPM-DM-APAP (TYLENOL COLD PO) Take by mouth.    . simvastatin (ZOCOR) 20 MG tablet Take 20 mg by mouth daily.    Marland Kitchen zolpidem (AMBIEN) 10 MG tablet      No current facility-administered medications on file prior to visit.     No Known Allergies  Blood pressure (!) 80/56, pulse 70.  Orthostatic hypotension Blood pressure while sitting was 102/60 then drop to 80/56 upon standing.  Long time during visit was spent discussing non-pharmacological options like proper hydration, liberal salt intake and using compresion stocking as well as important of compliance with Midodrine as prescribed and avoid self-adjustment of medication. Noted BP average difference between supine and standing position was 48 mm/Hg  while taking midodrine versus 65 mmHg after patient stopped taking midodrine.   I am unable to adjust midodrine dose today after patient stopped taking over 4 weeks ago. Due to patient's reluctance to take  Medication 3 times daily;  I will resume midodrine 2.5mg  daily when patient expected to be sitting or standing for long periods of tim and as needed for hypotension. Patient will continue to work on non-pharmacological therapy, start wearing high pressure compression stocking and contact PCP to re-assess sleeping aid therapy. She will continue twice daily BP monitoring to bring records to next BP follow up in 4 weeks.  Ava Tangney Cardenas PharmD, BCPS, [place] [address] [place] [address] 06/11/2017 1:32 PM

## 2017-06-11 NOTE — Assessment & Plan Note (Signed)
Blood pressure while sitting was 102/60 then drop to 80/56 upon standing.  Long time during visit was spent discussing non-pharmacological options like proper hydration, liberal salt intake and using compresion stocking as well as important of compliance with Midodrine as prescribed and avoid self-adjustment of medication. Noted BP average difference between supine and standing position was 48 mm/Hg while taking midodrine versus 65 mmHg after patient stopped taking midodrine.   I am unable to adjust midodrine dose today after patient stopped taking over 4 weeks ago. Due to patient's reluctance to take  Medication 3 times daily;  I will resume midodrine 2.5mg  daily when patient expected to be sitting or standing for long periods of tim and as needed for hypotension. Patient will continue to work on non-pharmacological therapy, start wearing high pressure compression stocking and contact PCP to re-assess sleeping aid therapy. She will continue twice daily BP monitoring to bring records to next BP follow up in 4 weeks.

## 2017-06-12 ENCOUNTER — Encounter: Payer: Self-pay | Admitting: Pharmacist

## 2017-06-17 DIAGNOSIS — I951 Orthostatic hypotension: Secondary | ICD-10-CM | POA: Diagnosis not present

## 2017-06-17 DIAGNOSIS — R531 Weakness: Secondary | ICD-10-CM | POA: Diagnosis not present

## 2017-06-17 DIAGNOSIS — R300 Dysuria: Secondary | ICD-10-CM | POA: Diagnosis not present

## 2017-06-20 DIAGNOSIS — I951 Orthostatic hypotension: Secondary | ICD-10-CM | POA: Diagnosis not present

## 2017-06-20 DIAGNOSIS — N390 Urinary tract infection, site not specified: Secondary | ICD-10-CM | POA: Diagnosis not present

## 2017-06-20 DIAGNOSIS — R300 Dysuria: Secondary | ICD-10-CM | POA: Diagnosis not present

## 2017-06-20 DIAGNOSIS — R531 Weakness: Secondary | ICD-10-CM | POA: Diagnosis not present

## 2017-06-21 DIAGNOSIS — N390 Urinary tract infection, site not specified: Secondary | ICD-10-CM | POA: Diagnosis not present

## 2017-06-25 ENCOUNTER — Encounter (HOSPITAL_COMMUNITY): Payer: Self-pay

## 2017-06-25 ENCOUNTER — Emergency Department (HOSPITAL_COMMUNITY)
Admission: EM | Admit: 2017-06-25 | Discharge: 2017-06-25 | Disposition: A | Discharge status: Home / Self Care | Payer: Medicare Other | Attending: Emergency Medicine | Admitting: Emergency Medicine

## 2017-06-25 DIAGNOSIS — R531 Weakness: Secondary | ICD-10-CM | POA: Diagnosis present

## 2017-06-25 DIAGNOSIS — Z7982 Long term (current) use of aspirin: Secondary | ICD-10-CM | POA: Insufficient documentation

## 2017-06-25 DIAGNOSIS — Z79899 Other long term (current) drug therapy: Secondary | ICD-10-CM | POA: Diagnosis not present

## 2017-06-25 DIAGNOSIS — R5383 Other fatigue: Secondary | ICD-10-CM | POA: Diagnosis not present

## 2017-06-25 DIAGNOSIS — E7800 Pure hypercholesterolemia, unspecified: Secondary | ICD-10-CM | POA: Diagnosis not present

## 2017-06-25 LAB — URINALYSIS, ROUTINE W REFLEX MICROSCOPIC
BILIRUBIN URINE: NEGATIVE
Bacteria, UA: NONE SEEN
GLUCOSE, UA: NEGATIVE mg/dL
HGB URINE DIPSTICK: NEGATIVE
KETONES UR: 5 mg/dL — AB
NITRITE: NEGATIVE
PH: 7 (ref 5.0–8.0)
Protein, ur: NEGATIVE mg/dL
SPECIFIC GRAVITY, URINE: 1.01 (ref 1.005–1.030)
Squamous Epithelial / LPF: NONE SEEN

## 2017-06-25 LAB — COMPREHENSIVE METABOLIC PANEL
ALT: 20 U/L (ref 14–54)
ANION GAP: 7 (ref 5–15)
AST: 29 U/L (ref 15–41)
Albumin: 4 g/dL (ref 3.5–5.0)
Alkaline Phosphatase: 60 U/L (ref 38–126)
BUN: 15 mg/dL (ref 6–20)
CHLORIDE: 100 mmol/L — AB (ref 101–111)
CO2: 25 mmol/L (ref 22–32)
Calcium: 8.9 mg/dL (ref 8.9–10.3)
Creatinine, Ser: 0.74 mg/dL (ref 0.44–1.00)
Glucose, Bld: 95 mg/dL (ref 65–99)
POTASSIUM: 3.3 mmol/L — AB (ref 3.5–5.1)
Sodium: 132 mmol/L — ABNORMAL LOW (ref 135–145)
TOTAL PROTEIN: 6.9 g/dL (ref 6.5–8.1)
Total Bilirubin: 0.6 mg/dL (ref 0.3–1.2)

## 2017-06-25 LAB — CBC WITH DIFFERENTIAL/PLATELET
Basophils Absolute: 0 10*3/uL (ref 0.0–0.1)
Basophils Relative: 0 %
Eosinophils Absolute: 0.1 10*3/uL (ref 0.0–0.7)
Eosinophils Relative: 1 %
HCT: 39.5 % (ref 36.0–46.0)
Hemoglobin: 12.7 g/dL (ref 12.0–15.0)
LYMPHS PCT: 23 %
Lymphs Abs: 1.5 10*3/uL (ref 0.7–4.0)
MCH: 31.2 pg (ref 26.0–34.0)
MCHC: 32.2 g/dL (ref 30.0–36.0)
MCV: 97.1 fL (ref 78.0–100.0)
MONO ABS: 0.6 10*3/uL (ref 0.1–1.0)
MONOS PCT: 9 %
NEUTROS ABS: 4.5 10*3/uL (ref 1.7–7.7)
Neutrophils Relative %: 67 %
PLATELETS: 199 10*3/uL (ref 150–400)
RBC: 4.07 MIL/uL (ref 3.87–5.11)
RDW: 13.4 % (ref 11.5–15.5)
WBC: 6.7 10*3/uL (ref 4.0–10.5)

## 2017-06-25 MED ORDER — SODIUM CHLORIDE 0.9 % IV BOLUS (SEPSIS)
1000.0000 mL | Freq: Once | INTRAVENOUS | Status: AC
  Administered 2017-06-25: 1000 mL via INTRAVENOUS

## 2017-06-25 NOTE — ED Triage Notes (Signed)
Pt has been seen in past several months for weakness, orthostatic hypotension, fatigue.  Symptoms are worsening.

## 2017-06-25 NOTE — ED Provider Notes (Signed)
[facility] EMERGENCY DEPARTMENT Provider Note  CSN: [id number] Arrival date & time: 06/25/17 1616  Chief Complaint(s) Weakness  HPI Jodi Cardenas is a 71 y.o. female   The history is provided by the patient.  Weakness  Primary symptoms comment: generalized. This is a chronic (approx 3-4 months) problem. The problem has been gradually worsening. There was no focality noted. There has been no fever. Pertinent negatives include no shortness of breath, no chest pain, no altered mental status, no confusion and no headaches. Associated medical issues do not include trauma or CVA.    Past Medical History Past Medical History:  Diagnosis Date  . Dizziness   . Hypercholesteremia   . Osteopenia   . Scoliosis    Patient Active Problem List   Diagnosis Date Noted  . Chronic back pain 04/04/2017  . Osteopenia 04/04/2017  . Hyperlipidemia 02/25/2017  . Orthostatic hypotension 02/25/2017   Home Medication(s) Prior to Admission medications   Medication Sig Start Date End Date Taking? Authorizing Provider  Acetaminophen (TYLENOL ARTHRITIS PAIN PO) Take 1,250 mg by mouth as needed. Extended release    Yes [provider]  aspirin 81 MG tablet Take 81 mg by mouth daily.   Yes [provider]  Biotin 2500 MCG CAPS Take by mouth 2 (two) times daily.   Yes [provider]  Coenzyme Q-10 100 MG capsule Take 100 mg by mouth daily.   Yes [provider]  DULoxetine (CYMBALTA) 60 MG capsule Take 60 mg by mouth daily. 06/22/17  Yes [provider]  meloxicam (MOBIC) 7.5 MG tablet Take 7.5 mg by mouth daily.  02/07/17  Yes [provider]  midodrine (PROAMATINE) 2.5 MG tablet Take 1 tablet (2.5 mg total) by mouth 3 (three) times daily with meals. Patient taking differently: Take 2.5 mg by mouth See admin instructions. Take 1 a day sometimes twice a day 05/04/17  Yes Lorretta Harp, MD  Multiple Vitamin (MULTIVITAMIN) capsule  Take 1 capsule by mouth daily.   Yes [provider]  OVER THE COUNTER MEDICATION Take 250 mg by mouth daily. Resverartol   Yes [provider]  Probiotic Product (PROBIOTIC DAILY PO) Take by mouth.   Yes [provider]  Vitamin D, Ergocalciferol, 2000 units CAPS Take 2,000 Units by mouth daily.   Yes [provider]  zinc sulfate 220 (50 Zn) MG capsule Take 220 mg by mouth daily.   Yes [provider]                                                                                                                                    Past Surgical History Past Surgical History:  Procedure Laterality Date  . EYE SURGERY     lens Implants x 2  . RETINAL DETACHMENT SURGERY    . TONSILLECTOMY    . TUBAL LIGATION    . WRIST SURGERY Right  Family History Family History  Problem Relation Age of Onset  . Heart disease Father     Social History Social History  Substance Use Topics  . Smoking status: Never Smoker  . Smokeless tobacco: Never Used  . Alcohol use Yes     Comment: occasional wine   Allergies Patient has no known allergies.  Review of Systems Review of Systems  Respiratory: Negative for shortness of breath.   Cardiovascular: Negative for chest pain.  Neurological: Positive for weakness. Negative for headaches.  Psychiatric/Behavioral: Negative for confusion.   All other systems are reviewed and are negative for acute change except as noted in the HPI  Physical Exam Vital Signs  I have reviewed the triage vital signs BP (!) 166/81 (BP Location: Right Arm)   Pulse 86   Temp 97.6 F (36.4 C) (Oral)   Resp 16   SpO2 100%   Physical Exam  Constitutional: She is oriented to person, place, and time. She appears well-developed and well-nourished. No distress.  HENT:  Head: Normocephalic and atraumatic.  Nose: Nose normal.  Eyes: Pupils are equal, round, and reactive to light. Conjunctivae and EOM are normal. Right eye  exhibits no discharge. Left eye exhibits no discharge. No scleral icterus.  Neck: Normal range of motion. Neck supple.  Cardiovascular: Normal rate and regular rhythm.  Exam reveals no gallop and no friction rub.   No murmur heard. Pulmonary/Chest: Effort normal and breath sounds normal. No stridor. No respiratory distress. She has no rales.  Abdominal: Soft. She exhibits no distension. There is no tenderness.  Musculoskeletal: She exhibits no edema or tenderness.  Kyphotic  Neurological: She is alert and oriented to person, place, and time.  Mental Status:  Alert and oriented to person, place, and time.  Attention and concentration normal.  Speech clear.  Recent memory is intact  Cranial Nerves:  II Visual Fields: Intact to confrontation. Visual fields intact. III, IV, VI: Pupils equal and reactive to light and near. Full eye movement without nystagmus  V Facial Sensation: Normal. No weakness of masticatory muscles  VII: No facial weakness or asymmetry  VIII Auditory Acuity: Grossly normal  IX/X: The uvula is midline; the palate elevates symmetrically  XI: Normal sternocleidomastoid and trapezius strength  XII: The tongue is midline. No atrophy or fasciculations.   Motor System: Muscle Strength: 5/5 and symmetric in the upper and lower extremities. No pronation or drift.  Muscle Tone: Tone and muscle bulk are normal in the upper and lower extremities.   Reflexes: DTRs: 1+ and symmetrical in all four extremities. No Clonus Coordination: Intact finger-to-nose, heel-to-shin. No tremor.  Sensation: Intact to light touch, and pinprick. Gait: deferred.   Skin: Skin is warm and dry. No rash noted. She is not diaphoretic. No erythema.  Psychiatric: She has a normal mood and affect.  Vitals reviewed.   ED Results and Treatments Labs (all labs ordered are listed, but only abnormal results are displayed) Labs Reviewed  COMPREHENSIVE METABOLIC PANEL - Abnormal; Notable for the  following:       Result Value   Sodium 132 (*)    Potassium 3.3 (*)    Chloride 100 (*)    All other components within normal limits  URINALYSIS, ROUTINE W REFLEX MICROSCOPIC - Abnormal; Notable for the following:    APPearance HAZY (*)    Ketones, ur 5 (*)    Leukocytes, UA SMALL (*)    All other components within normal limits  CBC WITH DIFFERENTIAL/PLATELET  EKG  EKG Interpretation  Date/Time:    Ventricular Rate:    PR Interval:    QRS Duration:   QT Interval:    QTC Calculation:   R Axis:     Text Interpretation:        Radiology No results found. Pertinent labs & imaging results that were available during my care of the patient were reviewed by me and considered in my medical decision making (see chart for details).  Medications Ordered in ED Medications  sodium chloride 0.9 % bolus 1,000 mL (0 mLs Intravenous Stopped 06/25/17 2159)                                                                                                                                    Procedures Procedures  (including critical care time)  Medical Decision Making / ED Course I have reviewed the nursing notes for this encounter and the patient's prior records (if available in EHR or on provided paperwork).    Patient with persistent/worsening generalized fatigue who has had extensive workup by primary care provider and neurologist.  Here for persistent symptoms.  No acute change.  Screening labs grossly reassuring.  UA without evidence of infection.  No indication for advanced imaging at this time.  Recommended continued follow-up with primary care provider for outpatient workup and management.  The patient is safe for discharge with strict return precautions.  Final Clinical Impression(s) / ED Diagnoses Final diagnoses:  Fatigue, unspecified type    Disposition:  Discharge  Condition: Good  I have discussed the results, Dx and Tx plan with the patient and son who expressed understanding and agree(s) with the plan. Discharge instructions discussed at great length. The patient and son were given strict return precautions who verbalized understanding of the instructions. No further questions at time of discharge.    New Prescriptions   No medications on file    Follow Up: Leighton Ruff, [place] [address] [phone]  Schedule an appointment as soon as possible for a visit  As needed     This chart was dictated using voice recognition software.  Despite best efforts to proofread,  errors can occur which can change the documentation meaning.   Fatima Blank, MD 06/25/17 [phone]

## 2017-06-28 ENCOUNTER — Telehealth: Payer: Self-pay | Admitting: Neurology

## 2017-06-28 DIAGNOSIS — I951 Orthostatic hypotension: Secondary | ICD-10-CM | POA: Diagnosis not present

## 2017-06-28 DIAGNOSIS — M6281 Muscle weakness (generalized): Secondary | ICD-10-CM | POA: Diagnosis not present

## 2017-06-28 NOTE — Telephone Encounter (Signed)
Spoke to patient's husband - states his wife's health has been declining over 3-4 months.  He is okay with keeping her appt on 06/30/17.  I will keep his number and call him if there are any cancellations today or tomorrow.

## 2017-06-28 NOTE — Telephone Encounter (Signed)
Pt husband(on DPR) has called with great concern re: pt.  He states pt health and abilities to do for herself is declining rapidly.  He states the ER would not admit her because they could not dignose. Husband states he took pt to Endocrinologist and was told it is likely Neurological.  Pt shuffles along , unable to feed her self or dress herself.  Husband agreed to appointment 11-08 but would like to see if she can be seen sooner, please call

## 2017-06-30 ENCOUNTER — Encounter: Payer: Self-pay | Admitting: Neurology

## 2017-06-30 ENCOUNTER — Ambulatory Visit (INDEPENDENT_AMBULATORY_CARE_PROVIDER_SITE_OTHER): Payer: Medicare Other | Admitting: Neurology

## 2017-06-30 VITALS — BP 124/66 | HR 94 | Ht 62.5 in | Wt 111.5 lb

## 2017-06-30 DIAGNOSIS — G3281 Cerebellar ataxia in diseases classified elsewhere: Secondary | ICD-10-CM | POA: Diagnosis not present

## 2017-06-30 DIAGNOSIS — M545 Low back pain: Secondary | ICD-10-CM | POA: Diagnosis not present

## 2017-06-30 DIAGNOSIS — G8929 Other chronic pain: Secondary | ICD-10-CM

## 2017-06-30 DIAGNOSIS — R269 Unspecified abnormalities of gait and mobility: Secondary | ICD-10-CM | POA: Insufficient documentation

## 2017-06-30 NOTE — Progress Notes (Signed)
PATIENT: Jodi Cardenas DOB: Jan 29, 1946  Chief Complaint  Patient presents with  . Follow-up    midodrine (for orthostatic hypotension is taking once daily usually, Dr. Gwenlyn Found aware per pt).   . orthostatic hypotension    extreme weakness (hand grips, noted tremor, hard to get up from bed, shoulder feels frozen, drooling- trembling voice.  Also Mestinon 72m po TID, pt states took only about 1 week, did not notice any change, so stopped.       HISTORICAL  BBrynnlee Cardenas a 71years old right-handed female, seen in refer by her primary care doctor  BLeighton Cardenas for evaluation of dizziness, initial evaluation was April 04 2017.  I reviewed and summarized the referral note, she had a history of hyperlipidemia, osteopenia,chronic  insomnia, take Ambien159mprn. History of right eyelid retinal detachment.  I was able to review her ER presentation on Jan 14 2017, for evaluation of generalized fatigue and dizziness, not feeling well, recurrent lightheadedness and dizziness, there was documented orthostatic blood pressure changes, there was also evidence of UTI, her symptoms has much improved with IV steroid.  She had a history of kyphosis, scoliosis,progressive worsening neck to lower back pain, limited mobility, since May 2018, she began to notice dizziness, she only notices dizziness when she is up about, standing for few minutes, she felt lightheadedness, whoozing sensation, no vertigo, she denies similar dizziness in the sitting down or lying down position  Laboratory evaluations in July 2018, normal CMP, creatinine 0.77, normal TSH 1.90 CBC, hemoglobin of 13.6, triglyceride 96, triglycerides right148, LDL 61.  UPDATE April 22 2017: Laboratory evaluation showed normal ESR C-reactive protein, protein electrophoresis, ANA, copper, vitamin B1, D, CPK, thyroid functional tasks, B12, A1c was 5.4.  Today's electrodiagnostic studies normal, there is no evidence of peripheral neuropathy,  she tried Cymbalta 60 mg daily, did not help her symptoms, she continue complaining significant multiple joints pain, shoulder pain, chronic low back pain, dizziness when getting up quickly, husband reported a positive orthostatic blood pressure changes.   Update July 01 2017: She is accompanied by her son at today's clinical visit, she has gradually declining functional status, increased low back pain, shoulder pain, unsteady gait, lightheadedness when get up quickly. Midodrine provide limited help   REVIEW OF SYSTEMS: Full 14 system review of systems performed and notable only for activity change, fatigue, drooling, abdominal pain, constipation, frequent wakening, urgency, joint pain, back pain, walking difficulty, dizziness, speech difficulty, weakness, tremor, facial drooling  ALLERGIES: No Known Allergies  HOME MEDICATIONS: Current Outpatient Medications  Medication Sig Dispense Refill  . Acetaminophen (TYLENOL ARTHRITIS PAIN PO) Take 1,250 mg by mouth as needed. Extended release     . aspirin 81 MG tablet Take 81 mg by mouth daily.    . Biotin 2500 MCG CAPS Take by mouth 2 (two) times daily.    . Coenzyme Q-10 100 MG capsule Take 100 mg by mouth daily.    . DULoxetine (CYMBALTA) 60 MG capsule Take 60 mg by mouth daily.    . meloxicam (MOBIC) 7.5 MG tablet Take 7.5 mg by mouth daily.     . midodrine (PROAMATINE) 2.5 MG tablet Take 1 tablet (2.5 mg total) by mouth 3 (three) times daily with meals. (Patient taking differently: Take 2.5 mg by mouth See admin instructions. Take 1 a day sometimes twice a day) 90 tablet 3  . Multiple Vitamin (MULTIVITAMIN) capsule Take 1 capsule by mouth daily.    . Marland KitchenVER THE COUNTER MEDICATION Take  250 mg by mouth daily. Resverartol    . Probiotic Product (PROBIOTIC DAILY PO) Take by mouth.    . Vitamin D, Ergocalciferol, 2000 units CAPS Take 2,000 Units by mouth daily.    Marland Kitchen zinc sulfate 220 (50 Zn) MG capsule Take 220 mg by mouth daily.     No current  facility-administered medications for this visit.     PAST MEDICAL HISTORY: Past Medical History:  Diagnosis Date  . Dizziness   . Hypercholesteremia   . Osteopenia   . Scoliosis     PAST SURGICAL HISTORY: Past Surgical History:  Procedure Laterality Date  . EYE SURGERY     lens Implants x 2  . RETINAL DETACHMENT SURGERY    . TONSILLECTOMY    . TUBAL LIGATION    . WRIST SURGERY Right     FAMILY HISTORY: Family History  Problem Relation Age of Onset  . Heart disease Father     SOCIAL HISTORY:  Social History   Socioeconomic History  . Marital status: Married    Spouse name: Not on file  . Number of children: 3  . Years of education: College  . Highest education level: Not on file  Social Needs  . Financial resource strain: Not on file  . Food insecurity - worry: Not on file  . Food insecurity - inability: Not on file  . Transportation needs - medical: Not on file  . Transportation needs - non-medical: Not on file  Occupational History  . Occupation: Retired Therapist, sports  Tobacco Use  . Smoking status: Never Smoker  . Smokeless tobacco: Never Used  Substance and Sexual Activity  . Alcohol use: Yes    Comment: occasional wine  . Drug use: No  . Sexual activity: Not on file  Other Topics Concern  . Not on file  Social History Narrative   Lives at home with husband.   Right-handed.   Two cups coffee per day.     PHYSICAL EXAM   Vitals:   06/30/17 1142  BP: 124/66  Pulse: 94  Weight: 111 lb 8 oz (50.6 kg)  Height: 5' 2.5" (1.588 m)    Not recorded     Blood pressure sitting down 105/74 heart rate of 93, standing up 85/57, heart rate of 96. Body mass index is 20.07 kg/m.  PHYSICAL EXAMNIATION:  Gen: NAD, conversant, well nourised, obese, well groomed                     Cardiovascular: Regular rate rhythm, no peripheral edema, warm, nontender. Eyes: Conjunctivae clear without exudates or hemorrhage Neck: Supple, no carotid bruits. Pulmonary: Clear  to auscultation bilaterally   NEUROLOGICAL EXAM:  MENTAL STATUS: Speech:    Speech is normal; fluent and spontaneous with normal comprehension.  Cognition:     Orientation to time, place and person     Normal recent and remote memory     Normal Attention span and concentration     Normal Language, naming, repeating,spontaneous speech     Fund of knowledge   CRANIAL NERVES: CN II: Visual fields are full to confrontation. Fundoscopic exam is normal with sharp discs and no vascular changes. Pupils are round equal and briskly reactive to light. CN III, IV, VI: extraocular movement are normal. No ptosis. CN V: Facial sensation is intact to pinprick in all 3 divisions bilaterally. Corneal responses are intact.  CN VII: Face is symmetric with normal eye closure and smile. CN VIII: Hearing is normal to rubbing  fingers CN IX, X: Palate elevates symmetrically. Phonation is normal. CN XI: Head turning and shoulder shrug are intact CN XII: Tongue is midline with normal movements and no atrophy.  MOTOR: There is no pronator drift of out-stretched arms. Muscle bulk and tone are normal. Muscle strength is normal.  REFLEXES: Reflexes are 2+ and symmetric at the biceps, triceps, knees, and ankles. Plantar responses are flexor.  SENSORY: length dependent decreased to light touch, pinprick and the vibratory sensation at toes, distal leg  COORDINATION: Rapid alternating movements and fine finger movements are intact. There is no dysmetria on finger-to-nose and heel-knee-shin.    GAIT/STANCE: She needs push up to get up from seated position, mildly unsteady, kyphosis   DIAGNOSTIC DATA (LABS, IMAGING, TESTING) - I reviewed patient records, labs, notes, testing and imaging myself where available.   ASSESSMENT AND PLAN  Danene Montijo is a 71 y.o. female   Orthostatic dizziness  There is no evidence of large fiber peripheral neuropathy or other central nervous system degenerative disorder  based on current evaluations  Extensive laboratory evaluation failed to demonstrate etiology  I have advised her continue to be active, increase water intake, continue Mestinon lower dose 30 mg twice a 3 times a day to avoid the GI side effect, midodrine as needed  Gait abnormality  She was found to have hyperreflexia on examinations,  Need to rule out cervical spondylitic myelopathy, proceed with MRI of cervical spine   Marcial Pacas, M.D. Ph.D.  [facility] [address] [place], [place] [address] Ph: [phone] Fax: [phone]  CC: Jodi Ruff, MD

## 2017-07-01 ENCOUNTER — Encounter: Payer: Self-pay | Admitting: Neurology

## 2017-07-01 ENCOUNTER — Ambulatory Visit: Payer: Self-pay | Admitting: Endocrinology

## 2017-07-07 ENCOUNTER — Ambulatory Visit: Payer: Medicare Other

## 2017-07-07 NOTE — Progress Notes (Deleted)
Patient ID: Jodi Cardenas                 DOB: Feb 06, 1946                      MRN: [id number]     HPI: Jodi Cardenas is a 71 y.o. female referred by Dr. Gwenlyn Found to HTN clinic. PMH includes hyperlipidemia, chronic back pain and symptomatic hypotension. During most recent office visit, on 05/04/2017 , Midodrine 2.41m three tmes daily was added to her therapy. Today patient presents to HTN clinic for evaulation and medication titration as needed. Patient denies increased fatigue or dizziness. Last syncopal episode was > 1 months ago.  Jodi Cardenas comes to clinica accompany by her husband and reports only taking midodrine for 5 days, then stopped taking due to lack response and fear of elevated BP when lying down. She also stopped taking Ambien and Zocor after husband Chief Financial Officer) read potential side effects of medication.  ER visit on 11-3 for generalized weakness  meloxicam Midodrine Compression stockings Sleep aid  Current hypotension meds:  Midodrine 2.5mg  TID while awake and <4hrs from bedtime (not taking)  Social History: denies tobacco or alcohol use  Diet: liberalize salt intake, mostly home cooked meals  Exercise: minimal due to chronic back pain  Home BP readings:  57 readings supine; average 152/77 (pulse 78 bpm) 57 readings sitting; average 125/71 (pulse 90 bpm) 57 readings standing; average 95/58 (pulse 102 bpm)  Labs:  06/2017:  Na 132, K 3.3, Glu 95, BUN 15, SCr 0.74  Wt Readings from Last 3 Encounters:  06/30/17 111 lb 8 oz (50.6 kg)  05/04/17 111 lb 3.2 oz (50.4 kg)  04/04/17 116 lb (52.6 kg)   BP Readings from Last 3 Encounters:  06/30/17 124/66  06/25/17 (!) 141/75  06/08/17 (!) 80/56   Pulse Readings from Last 3 Encounters:  06/30/17 94  06/25/17 95  06/08/17 70    Renal function: Estimated Creatinine Clearance: 50.8 mL/min (by C-G formula based on SCr of 0.74 mg/dL).  Past Medical History:  Diagnosis Date  . Dizziness   . Hypercholesteremia   .  Osteopenia   . Scoliosis     Current Outpatient Medications on File Prior to Visit  Medication Sig Dispense Refill  . Acetaminophen (TYLENOL ARTHRITIS PAIN PO) Take 1,250 mg by mouth as needed. Extended release     . aspirin 81 MG tablet Take 81 mg by mouth daily.    . Biotin 2500 MCG CAPS Take by mouth 2 (two) times daily.    . Coenzyme Q-10 100 MG capsule Take 100 mg by mouth daily.    . DULoxetine (CYMBALTA) 60 MG capsule Take 60 mg by mouth daily.    . meloxicam (MOBIC) 7.5 MG tablet Take 7.5 mg by mouth daily.     . midodrine (PROAMATINE) 2.5 MG tablet Take 1 tablet (2.5 mg total) by mouth 3 (three) times daily with meals. (Patient taking differently: Take 2.5 mg by mouth See admin instructions. Take 1 a day sometimes twice a day) 90 tablet 3  . Multiple Vitamin (MULTIVITAMIN) capsule Take 1 capsule by mouth daily.    Marland Kitchen OVER THE COUNTER MEDICATION Take 250 mg by mouth daily. Resverartol    . Probiotic Product (PROBIOTIC DAILY PO) Take by mouth.    . Vitamin D, Ergocalciferol, 2000 units CAPS Take 2,000 Units by mouth daily.    Marland Kitchen zinc sulfate 220 (50 Zn) MG capsule Take 220 mg by mouth daily.  No current facility-administered medications on file prior to visit.     No Known Allergies  There were no vitals taken for this visit.  No problem-specific Assessment & Plan notes found for this encounter.  Raquel Rodriguez-Guzman PharmD, BCPS, [place] [id number] 07/07/2017 11:24 AM

## 2017-07-13 ENCOUNTER — Ambulatory Visit
Admission: RE | Admit: 2017-07-13 | Discharge: 2017-07-13 | Disposition: A | Discharge status: Home / Self Care | Payer: Medicare Other | Source: Ambulatory Visit | Attending: Neurology | Admitting: Neurology

## 2017-07-13 DIAGNOSIS — G3281 Cerebellar ataxia in diseases classified elsewhere: Secondary | ICD-10-CM | POA: Diagnosis not present

## 2017-07-18 ENCOUNTER — Telehealth: Payer: Self-pay | Admitting: Neurology

## 2017-07-18 NOTE — Telephone Encounter (Signed)
Please call patient, MRI of cervical spine showed multilevel mild to moderate degenerative changes, there is evidence of deformity of the thecal sac at T1-T2 due to right paramedian disc herniation, there is no evidence of cord or nerve root compression.    IMPRESSION:  This MRI of the cervical spine without contrast shows mild to moderate multilevel degenerative changes as detailed above. The most significant findings are: 1.    At C7-T1, there is 3-4 mm of anterolisthesis and disc bulging. There does not appear to be any nerve root compression or spinal stenosis. 2.    At T1-T2, there is a right paramedian disc herniation that indents the thecal sac and distorts the spinal cord.    There is mild spinal stenosis. There does not appear to be nerve root compression. 3.    The spinal cord has normal signal.

## 2017-07-18 NOTE — Telephone Encounter (Signed)
Spoke to patient - she is aware of result and will keep her pending appt for further review.

## 2017-07-18 NOTE — Telephone Encounter (Signed)
Patient called and wanted to check on the results of her MRI. Please call and advise. [phone]

## 2017-07-20 ENCOUNTER — Ambulatory Visit (INDEPENDENT_AMBULATORY_CARE_PROVIDER_SITE_OTHER): Payer: Medicare Other | Admitting: Neurology

## 2017-07-20 ENCOUNTER — Encounter: Payer: Self-pay | Admitting: Neurology

## 2017-07-20 VITALS — BP 131/71 | HR 73 | Ht 62.5 in | Wt 108.5 lb

## 2017-07-20 DIAGNOSIS — R269 Unspecified abnormalities of gait and mobility: Secondary | ICD-10-CM | POA: Diagnosis not present

## 2017-07-20 DIAGNOSIS — G3281 Cerebellar ataxia in diseases classified elsewhere: Secondary | ICD-10-CM

## 2017-07-20 DIAGNOSIS — M545 Low back pain: Secondary | ICD-10-CM

## 2017-07-20 DIAGNOSIS — G8929 Other chronic pain: Secondary | ICD-10-CM | POA: Diagnosis not present

## 2017-07-20 DIAGNOSIS — I951 Orthostatic hypotension: Secondary | ICD-10-CM

## 2017-07-20 MED ORDER — CARBIDOPA-LEVODOPA 25-100 MG PO TABS: 1.0000 | ORAL_TABLET | Freq: Three times a day (TID) | ORAL | 3 refills | Status: DC

## 2017-07-20 NOTE — Progress Notes (Signed)
PATIENT: Jodi Cardenas DOB: 09-Mar-1946  Chief Complaint  Patient presents with  . Follow-up     HISTORICAL  Jodi Cardenas is a 71 years old right-handed female, seen in refer by her primary care doctor  Leighton Ruff, for evaluation of dizziness, initial evaluation was April 04 2017.  I reviewed and summarized the referral note, she had a history of hyperlipidemia, osteopenia,chronic  insomnia, take Ambien20m prn. History of right eye retinal detachment.  I was able to review her ER presentation on Jan 14 2017, for evaluation of generalized fatigue and dizziness, not feeling well, recurrent lightheadedness and dizziness, there was documented orthostatic blood pressure changes, there was also evidence of UTI, her symptoms has much improved with IV steroid.  She had a history of kyphosis, scoliosis,progressive worsening neck to lower back pain, limited mobility, since May 2018, she began to notice dizziness, she only notices dizziness when she is up about, standing for few minutes, she felt lightheadedness, whoozing sensation, no vertigo, she denies similar dizziness in the sitting down or lying down position  Laboratory evaluations in July 2018, normal CMP, creatinine 0.77, normal TSH 1.90 CBC, hemoglobin of 13.6, triglyceride 96, triglycerides right148, LDL 61.  UPDATE April 22 2017: Laboratory evaluation showed normal ESR C-reactive protein, protein electrophoresis, ANA, copper, vitamin B1, D, CPK, thyroid functional tasks, B12, A1c was 5.4.  Today's electrodiagnostic studies normal, there is no evidence of peripheral neuropathy, she tried Cymbalta 60 mg daily, did not help her symptoms, she continue complaining significant multiple joints pain, shoulder pain, chronic low back pain, dizziness when getting up quickly, husband reported a positive orthostatic blood pressure changes.   Update July 01 2017: She is accompanied by her son at today's clinical visit, she has  gradually declining functional status, increased low back pain, shoulder pain, unsteady gait, lightheadedness when get up quickly. Midodrine provide limited help  UPDATE Jul 20 2017: Accompanied by her husband and the son at today's clinical visit, just, especially from lying down to standing up, today lying down blood pressure 140/80, heart rate of 76, standing up 90/61 heart rate of 92, she does complains of orthostatic dizziness,  She also complains of worsening bilateral shoulder pain, intermittent bilateral hand tremor, right arm, leg weakness, there was mild parkinsonian features on today's examination,  I have personally reviewed MRI cervical multiple level degenerative changes, T1-T2, there is a right paramedian disc herniation indent the thecal sac, distort the spinal cord, there was no cord signal changes  REVIEW OF SYSTEMS: Full 14 system review of systems performed and notable only for activity change, fatigue, drooling, leg swelling, daytime sleepiness, constipation, joint pain, incontinence of bladder, frequent urination, back pain, walking difficulty, dizziness, speech difficulty, weakness, tremor  ALLERGIES: No Known Allergies  HOME MEDICATIONS: Current Outpatient Medications  Medication Sig Dispense Refill  . Acetaminophen (TYLENOL ARTHRITIS PAIN PO) Take 1,250 mg by mouth as needed. Extended release     . aspirin 81 MG tablet Take 81 mg by mouth daily.    . Biotin 2500 MCG CAPS Take by mouth 2 (two) times daily.    . Coenzyme Q-10 100 MG capsule Take 100 mg by mouth daily.    . DULoxetine (CYMBALTA) 60 MG capsule Take 60 mg by mouth daily.    . Multiple Vitamin (MULTIVITAMIN) capsule Take 1 capsule by mouth daily.    .Marland KitchenOVER THE COUNTER MEDICATION Take 250 mg by mouth daily. Resverartol    . Probiotic Product (PROBIOTIC DAILY PO) Take by mouth.    .Marland Kitchen  pyridostigmine (MESTINON) 60 MG tablet Take 1 tablet by mouth 3 (three) times daily.    . Vitamin D, Ergocalciferol, 2000  units CAPS Take 2,000 Units by mouth daily.     No current facility-administered medications for this visit.     PAST MEDICAL HISTORY: Past Medical History:  Diagnosis Date  . Dizziness   . Hypercholesteremia   . Osteopenia   . Scoliosis     PAST SURGICAL HISTORY: Past Surgical History:  Procedure Laterality Date  . EYE SURGERY     lens Implants x 2  . RETINAL DETACHMENT SURGERY    . TONSILLECTOMY    . TUBAL LIGATION    . WRIST SURGERY Right     FAMILY HISTORY: Family History  Problem Relation Age of Onset  . Heart disease Father     SOCIAL HISTORY:  Social History   Socioeconomic History  . Marital status: Married    Spouse name: Not on file  . Number of children: 3  . Years of education: College  . Highest education level: Not on file  Social Needs  . Financial resource strain: Not on file  . Food insecurity - worry: Not on file  . Food insecurity - inability: Not on file  . Transportation needs - medical: Not on file  . Transportation needs - non-medical: Not on file  Occupational History  . Occupation: Retired Therapist, sports  Tobacco Use  . Smoking status: Never Smoker  . Smokeless tobacco: Never Used  Substance and Sexual Activity  . Alcohol use: Yes    Comment: occasional wine  . Drug use: No  . Sexual activity: Not on file  Other Topics Concern  . Not on file  Social History Narrative   Lives at home with husband.   Right-handed.   Two cups coffee per day.     PHYSICAL EXAM   Vitals:   07/20/17 1611  BP: 131/71  Pulse: 73  Weight: 108 lb 8 oz (49.2 kg)  Height: 5' 2.5" (1.588 m)    Not recorded     Blood pressure sitting down to 140/80 heart rate of 76, standing up 90/61, heart rate of 92 Body mass index is 19.53 kg/m.  PHYSICAL EXAMNIATION:  Gen: NAD, conversant, well nourised, obese, well groomed                     Cardiovascular: Regular rate rhythm, no peripheral edema, warm, nontender. Eyes: Conjunctivae clear without exudates or  hemorrhage Neck: Supple, no carotid bruits. Pulmonary: Clear to auscultation bilaterally   NEUROLOGICAL EXAM:  MENTAL STATUS: Speech:    Speech is normal; fluent and spontaneous with normal comprehension.  Cognition:     Orientation to time, place and person     Normal recent and remote memory     Normal Attention span and concentration     Normal Language, naming, repeating,spontaneous speech     Fund of knowledge   CRANIAL NERVES: CN II: Visual fields are full to confrontation. Fundoscopic exam is normal with sharp discs and no vascular changes. Pupils are round equal and briskly reactive to light. CN III, IV, VI: extraocular movement are normal. No ptosis. CN V: Facial sensation is intact to pinprick in all 3 divisions bilaterally. Corneal responses are intact.  CN VII: Face is symmetric with normal eye closure and smile. CN VIII: Hearing is normal to rubbing fingers CN IX, X: Palate elevates symmetrically. Phonation is normal. CN XI: Head turning and shoulder shrug are intact CN  XII: Tongue is midline with normal movements and no atrophy.  MOTOR: She has mild fixation of right upper extremity upon rapid rotating movement, rigidity, bradykinesia in all 4 extremities, increased with reinforcement maneuver, right worse than left,  REFLEXES: Reflexes are 2+ and symmetric at the biceps, triceps, knees, and ankles. Plantar responses are flexor.  SENSORY: length dependent decreased to light touch, pinprick and the vibratory sensation at toes, distal leg  COORDINATION: Rapid alternating movements and fine finger movements are intact. There is no dysmetria on finger-to-nose and heel-knee-shin.    GAIT/STANCE: She needs push up to get up from seated position, mildly unsteady, kyphosis, small stride,   DIAGNOSTIC DATA (LABS, IMAGING, TESTING) - I reviewed patient records, labs, notes, testing and imaging myself where available.   ASSESSMENT AND PLAN  Jaylinn Hellenbrand is a 71  y.o. female   Orthostatic dizziness  There is mild parkinsonian features on evaluation, more on the right side,  Extensive laboratory evaluation failed to demonstrate etiology  Differentiation diagnoses include Parkinson plus syndrome,  She denies significant improvement with Cymbalta, will stop Cymbalta, Mestinon 60 mg 3 times a day was able to stabilize her blood pressure from sitting down to standing up, though she still have significant blood pressure drop from lying down to standing up  Will try low-dose of Sinemet 25/100 mg half tablet 3 times a day, titrating to 1 tablet 3 times a day  MRI brain, thoracic spine  Gait abnormality  Multifactorial including orthostatic blood pressure changes, deconditioning, chronic pain  Referral to home physical therapy   Marcial Pacas, M.D. Ph.D.  [facility] [address], [place], [place] [address] Ph: [phone] Fax: [phone]  CC: Leighton Ruff, MD

## 2017-07-20 NOTE — Patient Instructions (Signed)
NORTHERA (droxidopa)

## 2017-07-26 ENCOUNTER — Telehealth: Payer: Self-pay | Admitting: Neurology

## 2017-07-26 DIAGNOSIS — I951 Orthostatic hypotension: Secondary | ICD-10-CM

## 2017-07-26 DIAGNOSIS — G119 Hereditary ataxia, unspecified: Secondary | ICD-10-CM

## 2017-07-26 DIAGNOSIS — M545 Low back pain: Secondary | ICD-10-CM

## 2017-07-26 DIAGNOSIS — R269 Unspecified abnormalities of gait and mobility: Secondary | ICD-10-CM

## 2017-07-26 DIAGNOSIS — G8929 Other chronic pain: Secondary | ICD-10-CM

## 2017-07-26 NOTE — Telephone Encounter (Signed)
Left message for patient. 12/4/20188:58 AM  mck

## 2017-07-26 NOTE — Telephone Encounter (Signed)
Spoke to patient - she is agreeable to outpatient PT.  Orders place in Epic.  She is aware to expect a call for scheduling.  She would like to be referred to a location closer to her home in [place].

## 2017-07-26 NOTE — Telephone Encounter (Signed)
My PT went to see the patient today in her home. The PT did the assessment and the patient explained how she drives everywhere and is on the go all the time. It was determined the patient is not homebound status. Under Medicare guidelines, a patient receiving home health in the home must be homebound. Which means it must be a taxing effort for the patient to leave the house. They can leave the home. Please call if you have questions. Thanks, Candi Leash   She will benefit from physical therapy, please talk with patient, I can put outpatient PT order

## 2017-07-26 NOTE — Addendum Note (Signed)
Addended by: Noberto Retort C on: 07/26/2017 09:06 AM   Modules accepted: Orders

## 2017-07-27 ENCOUNTER — Telehealth: Payer: Self-pay | Admitting: Neurology

## 2017-07-27 NOTE — Telephone Encounter (Signed)
Spoke to patient's husband - reports she started her full dose of Sinemet 25-100mg , one tablet TID on Monday, 07/25/17.  Since taking the full dose, she has been experiencing worsening of her hypotension with sitting and standing (last reading: sitting 93/54 and standing 77/42).  She had a fall today and hit her right side over her ribcage.  She is having significant sharp pains over that area, especially with any movement or deep breathing.  She is resting now with a heating pad to the area which seems to be helpful.  He wanted to have a rib x-ray.  Dr. Felecia Shelling was agreeable to place the order but the patient declined to go.  Additionally, states she was doing well when taking Sinemet 25-100mg , 0.5 tablets TID.  She had symptom improvement without problems with hypotension (although the tablets were hard to half).  He will give her 0.5 tablet for her remaining doses today and wait for a return phone call from [place] in the morning after her medications have been discussed with Dr. Krista Blue.

## 2017-07-27 NOTE — Telephone Encounter (Signed)
Patient's husband is calling stating patient has hypotension when she stands and when lying down or sitting up BP is elevated. He wants to discuss medications pyridostigmine (MESTINON) 60 MG tablet and carbidopa-levodopa (SINEMET IR) 25-100 MG tablet.

## 2017-07-28 ENCOUNTER — Encounter: Payer: Self-pay | Admitting: *Deleted

## 2017-07-28 DIAGNOSIS — G909 Disorder of the autonomic nervous system, unspecified: Secondary | ICD-10-CM | POA: Insufficient documentation

## 2017-07-28 NOTE — Telephone Encounter (Addendum)
Initial PA approved by [employer] [id number]) through 08/28/17. Pt [id number]. Case [id number].  The subsequent PA request will be eligible for a year approval.  The prescription was forward to [facility] to be filled.

## 2017-07-28 NOTE — Telephone Encounter (Signed)
Start form has been faxed and confirmed with the following: Last 3 office visits, med list, allergies, med release form, copy of insurance card, demographics, competed treatment form.

## 2017-07-28 NOTE — Telephone Encounter (Signed)
Dr. Krista Blue spoke with pt's husband, and sent rx. to pharmacy/[facility]

## 2017-07-28 NOTE — Telephone Encounter (Signed)
   Per husband, patient did have symptomatic improvement after taking Sinemet 25/100 half tablet 3 times a day, could not tolerate full tablet 3 times a day due to orthostatic hypotension  Will try Northera 100mg  tid

## 2017-07-28 NOTE — Telephone Encounter (Signed)
Spoke to patient's husband on HIPAA.  They will come by our office to sign the Northera start form.  Dr. Krista Blue is going to titrate her dose as follows:  1) Days 1-7: 100mg , TID 2) Days 8-15: 200mg , TID 3) Thereafter: 300mg , TID

## 2017-08-02 ENCOUNTER — Ambulatory Visit
Admission: RE | Admit: 2017-08-02 | Discharge: 2017-08-02 | Disposition: A | Discharge status: Home / Self Care | Payer: Medicare Other | Source: Ambulatory Visit | Attending: Neurology | Admitting: Neurology

## 2017-08-02 DIAGNOSIS — G3281 Cerebellar ataxia in diseases classified elsewhere: Secondary | ICD-10-CM

## 2017-08-02 DIAGNOSIS — R0789 Other chest pain: Secondary | ICD-10-CM | POA: Diagnosis not present

## 2017-08-02 DIAGNOSIS — I951 Orthostatic hypotension: Secondary | ICD-10-CM

## 2017-08-02 DIAGNOSIS — M545 Low back pain: Principal | ICD-10-CM

## 2017-08-02 DIAGNOSIS — S2239XA Fracture of one rib, unspecified side, initial encounter for closed fracture: Secondary | ICD-10-CM | POA: Diagnosis not present

## 2017-08-02 DIAGNOSIS — G20 Parkinson's disease: Secondary | ICD-10-CM | POA: Diagnosis not present

## 2017-08-02 DIAGNOSIS — G8929 Other chronic pain: Secondary | ICD-10-CM

## 2017-08-02 DIAGNOSIS — R269 Unspecified abnormalities of gait and mobility: Secondary | ICD-10-CM | POA: Diagnosis not present

## 2017-08-03 ENCOUNTER — Ambulatory Visit: Payer: Medicare Other | Admitting: Physical Therapy

## 2017-08-05 ENCOUNTER — Telehealth: Payer: Self-pay | Admitting: Neurology

## 2017-08-05 ENCOUNTER — Ambulatory Visit: Payer: Medicare Other | Admitting: Cardiovascular Disease

## 2017-08-05 NOTE — Telephone Encounter (Signed)
Please call patient, MRI of the thoracic spine showed exaggerated scoliosis to the right, degenerative changes, no evidence of cord compression, MRI of the brain showed no significant abnormality,  Has patient got the prescription of northera yet?    IMPRESSION: Slightly abnormal MRI scan of thoracic spine showing exaggerated scoliosis to right and only minor age-related degenerative changes without significant compression.   IMPRESSION: Unremarkable MRI scan of the brain without contrast.

## 2017-08-05 NOTE — Telephone Encounter (Signed)
Spoke to patient's husband on HIPAA - he verbalized understanding of MRI results.  Also, he is aware that Lauree Chandler has been approved by their prescription plan ([employer]).  The prescription was forwarded to [facility] to be filled.  I provided him with [facility]'s phone number so he can contact them to set up his personal account with them.  This will expedite the medication shipment.  He will call me if he runs into any problems with getting this medication filled.

## 2017-08-09 ENCOUNTER — Telehealth: Payer: Self-pay | Admitting: Neurology

## 2017-08-09 NOTE — Telephone Encounter (Signed)
Pt states that due to a fall she has broken some ribs and will need a few weeks to recover.  Pt wants to hold off on PT until her ribs have healed, she will call back when ready to start her PT.  Pt has asked for a call back from RN

## 2017-08-09 NOTE — Telephone Encounter (Signed)
Spoke to patient - she will call to reschedule her PT once ribs have healed.

## 2017-08-10 DIAGNOSIS — T8529XS Other mechanical complication of intraocular lens, sequela: Secondary | ICD-10-CM | POA: Diagnosis not present

## 2017-08-10 DIAGNOSIS — T8522XA Displacement of intraocular lens, initial encounter: Secondary | ICD-10-CM | POA: Diagnosis not present

## 2017-08-10 DIAGNOSIS — H33322 Round hole, left eye: Secondary | ICD-10-CM | POA: Diagnosis not present

## 2017-08-10 DIAGNOSIS — H33059 Total retinal detachment, unspecified eye: Secondary | ICD-10-CM | POA: Diagnosis not present

## 2017-08-11 DIAGNOSIS — C441121 Basal cell carcinoma of skin of right upper eyelid, including canthus: Secondary | ICD-10-CM | POA: Diagnosis not present

## 2017-08-11 DIAGNOSIS — C44111 Basal cell carcinoma of skin of unspecified eyelid, including canthus: Secondary | ICD-10-CM | POA: Diagnosis not present

## 2017-08-24 ENCOUNTER — Encounter: Payer: Self-pay | Admitting: *Deleted

## 2017-08-25 NOTE — Telephone Encounter (Signed)
Dr. Krista Blue has reviewed patient's chart and would like her to discontinue carbidopa-levodopa. It is okay for her to continue naproxen for her pain.  Her Northera appeal was finally approved today for both the medication and quantity.  The appeals approval was through [employer] [id number]) - case ID: [id number] - valid through 08/22/2018.  I also called [facility] to make them aware of these approvals so they can process her prescription in an expedited manner.    The patient would like to wait and start Northera since it should be delivered next week.  She is being titrated as follows:  Days 1-7: 100mg , TID Days 8-15: 200mg , TID Thereafter 300mg  TID

## 2017-08-25 NOTE — Telephone Encounter (Addendum)
Pt called, her husband set up contact information, they did rec a letter stating [facility] was working on it but she has yet to rec the medication.  FYI Pt said she has been taking carbidopa levodopa 1/2 tab tid, she is wondering if she should go up on this.  FYI she has been taking naproxen 375mg  tid for a couple of weeks for fx ribs. She said her BP is running a little low, should any medication be changed at this time. Please call to advise at [phone]

## 2017-08-29 NOTE — Telephone Encounter (Signed)
Called [facility] (spoke to [place]) to check on Northera - the prescription is being evaluated by their pharmacist to fill for the patient.  They are hopeful this medication will be ready to ship by tomorrow.

## 2017-08-30 NOTE — Telephone Encounter (Signed)
Called [facility] again today and spoke to [place] scientist) who will reach out to the pharmacy to see how quickly this shipment can be scheduled.  She will call me back to provide an update.

## 2017-09-01 NOTE — Telephone Encounter (Signed)
Called [facility] again and spoke to Amy who has placed an urgent request for delivery.  States she will follow through on this problem and make sure the patient is contacted to schedule shipment.

## 2017-09-02 ENCOUNTER — Encounter: Payer: Self-pay | Admitting: Cardiovascular Disease

## 2017-09-02 ENCOUNTER — Ambulatory Visit (INDEPENDENT_AMBULATORY_CARE_PROVIDER_SITE_OTHER): Payer: Medicare Other | Admitting: Cardiovascular Disease

## 2017-09-02 VITALS — BP 84/54 | HR 71 | Ht 62.5 in | Wt 106.0 lb

## 2017-09-02 DIAGNOSIS — I951 Orthostatic hypotension: Secondary | ICD-10-CM

## 2017-09-02 DIAGNOSIS — E7800 Pure hypercholesterolemia, unspecified: Secondary | ICD-10-CM

## 2017-09-02 NOTE — Assessment & Plan Note (Signed)
History of orthostatic hypotension previously tried on Midrin without significant benefit. Her blood pressure today is 84/54. She has lost 5 pounds over the last 2 months and is being seen by neurologist as well. We talked about liberalization of salt and compression stockings. At this point, I had no further recommendations.

## 2017-09-02 NOTE — Patient Instructions (Signed)

## 2017-09-02 NOTE — Assessment & Plan Note (Signed)
History of hyperlipidemia currently not on statin therapy followed by her PCP

## 2017-09-02 NOTE — Progress Notes (Signed)
09/02/2017 [name]   [birth year]  [id number]  Primary Physician Jodi Ruff, MD Primary Cardiologist: Jodi Harp MD Jodi Cardenas, [place]  HPI:  Jodi Cardenas is a 72 y.o.   thin appearing married Caucasian female mother of 53, grandmother of one grandchild accompanied by her husband Jodi Cardenas today. I last saw her in the office 05/04/17 . She was referred by Dr. Drema Cardenas for cardiovascular evaluation because of orthostatic hypotension. She has a history of hyperlipidemia as well as family history of heart disease with a father who died of a myocardial infarction at age 69. She has never had a heart attack or stroke. She denies chest pain or shortness of breath. She does complain of fatigue over the last year dizziness over the last 3 months primarily when changing positions consistent with orthostasis. She was recently seen in the emergency room on 01/14/17 was noted to be orthostatic and treated with IV fluids. Today in the office she is significantly orthostatic blood pressures that dropped from 140/76 lying to 111/70 standing associated with mild dizziness. I recommended liberalization of her salt intake  which did not improved her symptoms or blood pressure readings. She did take Midodrin for a short period time without significant benefit. She recently fell in her kitchen and cracked several ribs.   Current Meds  Medication Sig  . Acetaminophen (TYLENOL ARTHRITIS PAIN PO) Take 1,250 mg by mouth as needed. Extended release   . aspirin 81 MG tablet Take 81 mg by mouth daily.  . Biotin 2500 MCG CAPS Take by mouth 2 (two) times daily.  . Coenzyme Q-10 100 MG capsule Take 100 mg by mouth daily.  . Droxidopa (NORTHERA PO) Take by mouth.  . Multiple Vitamin (MULTIVITAMIN) capsule Take 1 capsule by mouth daily.  Marland Kitchen OVER THE COUNTER MEDICATION Take 250 mg by mouth daily. Resverartol  . Probiotic Product (PROBIOTIC DAILY PO) Take by mouth.  . pyridostigmine (MESTINON) 60 MG tablet Take  1 tablet by mouth 3 (three) times daily.  . Vitamin D, Ergocalciferol, 2000 units CAPS Take 2,000 Units by mouth daily.     No Known Allergies  Social History   Socioeconomic History  . Marital status: Married    Spouse name: Not on file  . Number of children: 3  . Years of education: College  . Highest education level: Not on file  Social Needs  . Financial resource strain: Not on file  . Food insecurity - worry: Not on file  . Food insecurity - inability: Not on file  . Transportation needs - medical: Not on file  . Transportation needs - non-medical: Not on file  Occupational History  . Occupation: Retired Therapist, sports  Tobacco Use  . Smoking status: Never Smoker  . Smokeless tobacco: Never Used  Substance and Sexual Activity  . Alcohol use: Yes    Comment: occasional wine  . Drug use: No  . Sexual activity: Not on file  Other Topics Concern  . Not on file  Social History Narrative   Lives at home with husband.   Right-handed.   Two cups coffee per day.     Review of Systems: General: negative for chills, fever, night sweats or weight changes.  Cardiovascular: negative for chest pain, dyspnea on exertion, edema, orthopnea, palpitations, paroxysmal nocturnal dyspnea or shortness of breath Dermatological: negative for rash Respiratory: negative for cough or wheezing Urologic: negative for hematuria Abdominal: negative for nausea, vomiting, diarrhea, bright red blood per rectum, melena, or hematemesis  Neurologic: negative for visual changes, syncope, or dizziness All other systems reviewed and are otherwise negative except as noted above.    Blood pressure (!) 84/54, pulse 71, height 5' 2.5" (1.588 m), weight 106 lb (48.1 kg).  General appearance: alert and no distress Neck: no adenopathy, no carotid bruit, no JVD, supple, symmetrical, trachea midline and thyroid not enlarged, symmetric, no tenderness/mass/nodules Lungs: clear to auscultation bilaterally Heart: regular  rate and rhythm, S1, S2 normal, no murmur, click, rub or gallop Extremities: extremities normal, atraumatic, no cyanosis or edema Pulses: 2+ and symmetric Skin: Skin color, texture, turgor normal. No rashes or lesions Neurologic: Alert and oriented X 3, normal strength and tone. Normal symmetric reflexes. Normal coordination and gait  EKG sinus rhythm at 71 without ST or T-wave changes. I personally reviewed this EKG.  ASSESSMENT AND PLAN:   Hyperlipidemia History of hyperlipidemia currently not on statin therapy followed by her PCP  Orthostatic hypotension History of orthostatic hypotension previously tried on Midrin without significant benefit. Her blood pressure today is 84/54. She has lost 5 pounds over the last 2 months and is being seen by neurologist as well. We talked about liberalization of salt and compression stockings. At this point, I had no further recommendations.      Jodi Harp MD FACP,FACC,FAHA, [facility] 09/02/2017 2:46 PM

## 2017-09-06 ENCOUNTER — Telehealth: Payer: Self-pay | Admitting: *Deleted

## 2017-09-06 NOTE — Telephone Encounter (Signed)
This medication has been sent to [facility] [place] and has an approved prior authorization.  The copay is over $2000.  Dr. Krista Blue would like to to check on patient assistance programs as quickly as possible for the patient.

## 2017-09-08 NOTE — Telephone Encounter (Signed)
Pt is wanting to know if she really needs to be put on the new medication. Pt isn't too happy with the side effect it can cause, she would rather go back on Droxidopa (NORTHERA PO) Please call to discuss

## 2017-09-08 NOTE — Telephone Encounter (Signed)
Spoke to patient - Dr. Krista Blue would like to speak further with her concerning starting new medications.  Her appt has been moved to 09/19/17 and she is agreeable to wait to discuss treatment.

## 2017-09-08 NOTE — Telephone Encounter (Signed)
Called patient and talked to her and she relayed she does not want to take Droxidopa (NORTHERA PO)  . Patient does not want patient assistance.   Patient want's to go back on her Sinemet.

## 2017-09-13 ENCOUNTER — Telehealth: Payer: Self-pay | Admitting: Neurology

## 2017-09-13 DIAGNOSIS — R269 Unspecified abnormalities of gait and mobility: Secondary | ICD-10-CM

## 2017-09-13 NOTE — Telephone Encounter (Signed)
I placed order for physical therapy

## 2017-09-19 ENCOUNTER — Ambulatory Visit (INDEPENDENT_AMBULATORY_CARE_PROVIDER_SITE_OTHER): Payer: Medicare Other | Admitting: Neurology

## 2017-09-19 ENCOUNTER — Encounter: Payer: Self-pay | Admitting: Neurology

## 2017-09-19 VITALS — BP 104/65 | HR 83 | Ht 62.5 in | Wt 107.0 lb

## 2017-09-19 DIAGNOSIS — G909 Disorder of the autonomic nervous system, unspecified: Secondary | ICD-10-CM | POA: Diagnosis not present

## 2017-09-19 DIAGNOSIS — G8929 Other chronic pain: Secondary | ICD-10-CM | POA: Diagnosis not present

## 2017-09-19 DIAGNOSIS — M546 Pain in thoracic spine: Secondary | ICD-10-CM

## 2017-09-19 MED ORDER — FLUDROCORTISONE ACETATE 0.1 MG PO TABS: 0.1000 mg | ORAL_TABLET | Freq: Every day | ORAL | 6 refills | Status: DC

## 2017-09-19 MED ORDER — OXYCODONE-ACETAMINOPHEN 2.5-325 MG PO TABS: 1.0000 | ORAL_TABLET | Freq: Three times a day (TID) | ORAL | 0 refills | Status: AC | PRN

## 2017-09-19 MED ORDER — FLUDROCORTISONE ACETATE 0.1 MG PO TABS: 0.1000 mg | ORAL_TABLET | Freq: Three times a day (TID) | ORAL | 6 refills | Status: DC

## 2017-09-19 NOTE — Progress Notes (Signed)
PATIENT: Jodi Cardenas DOB: 09-28-1945  Chief Complaint  Patient presents with  . Dizziness    She is here with her husband, Jeneen Rinks and her son, Mitzi Hansen.  Reports having a fall since last seen that resulted in broken ribs.  She had to delay physical therapy but has an evaluation scheduled for 09/20/17.  She decided against taking Northera due to the potential listed side effects.  They would like to discuss treatment options today.     HISTORICAL  Kimberla Driskill is a 72 years old right-handed female, seen in refer by her primary care doctor  Leighton Ruff, for evaluation of dizziness, initial evaluation was April 04 2017.  I reviewed and summarized the referral note, she had a history of hyperlipidemia, osteopenia,chronic  insomnia, take Ambien37m prn. History of right eye retinal detachment.  I was able to review her ER presentation on Jan 14 2017, for evaluation of generalized fatigue and dizziness, not feeling well, recurrent lightheadedness and dizziness, there was documented orthostatic blood pressure changes, there was also evidence of UTI, her symptoms has much improved with IV steroid.  She had a history of kyphosis, scoliosis,progressive worsening neck to lower back pain, limited mobility, since May 2018, she began to notice dizziness, she only notices dizziness when she is up about, standing for few minutes, she felt lightheadedness, whoozing sensation, no vertigo, she denies similar dizziness in the sitting down or lying down position  Laboratory evaluations in July 2018, normal CMP, creatinine 0.77, normal TSH 1.90 CBC, hemoglobin of 13.6, triglyceride 96, triglycerides right148, LDL 61.  UPDATE April 22 2017: Laboratory evaluation showed normal ESR C-reactive protein, protein electrophoresis, ANA, copper, vitamin B1, D, CPK, thyroid functional tasks, B12, A1c was 5.4.  Today's electrodiagnostic studies normal, there is no evidence of peripheral neuropathy, she tried  Cymbalta 60 mg daily, did not help her symptoms, she continue complaining significant multiple joints pain, shoulder pain, chronic low back pain, dizziness when getting up quickly, husband reported a positive orthostatic blood pressure changes.   Update July 01 2017: She is accompanied by her son at today's clinical visit, she has gradually declining functional status, increased low back pain, shoulder pain, unsteady gait, lightheadedness when get up quickly. Midodrine provide limited help  UPDATE Jul 20 2017: Accompanied by her husband and the son at today's clinical visit, just, especially from lying down to standing up, today lying down blood pressure 140/80, heart rate of 76, standing up 90/61 heart rate of 92, she does complains of orthostatic dizziness,  She also complains of worsening bilateral shoulder pain, intermittent bilateral hand tremor, right arm, leg weakness, there was mild parkinsonian features on today's examination,  I have personally reviewed MRI cervical multiple level degenerative changes, T1-T2, there is a right paramedian disc herniation indent the thecal sac, distort the spinal cord, there was no cord signal changes  UPDATE Sep 19 2017: She fell early December 2018 with multiple right-sided rib fracture, she also complains of worsening neck, low back pain, deconditioning,  Today she had a severe orthostatic hypotension, lying down blood pressure 126/62, heart rate of 74, 69/44, heart rate of 92, sitting down 100/70  She is taking 60 mg 3 times a day, was noted to have mild parkinsonian features, right worse than left, his right side arm swing, but the most debilitating symptoms are her significant orthostatic hypotension.  We have personally reviewed MRI of thoracic spine, exaggerated scoliosis, no spinal stenosis, MRI of the brain showed no significant abnormality.  MRI  of cervical spine showed multilevel degenerative changes, but there is no evidence of nerve root  compression  REVIEW OF SYSTEMS: Full 14 system review of systems performed and notable only for activity change, fatigue, drooling, leg swelling, daytime sleepiness, constipation, joint pain, incontinence of bladder, frequent urination, back pain, walking difficulty, dizziness, speech difficulty, weakness, tremor  ALLERGIES: No Known Allergies  HOME MEDICATIONS: Current Outpatient Medications  Medication Sig Dispense Refill  . Acetaminophen (TYLENOL ARTHRITIS PAIN PO) Take 1,250 mg by mouth as needed. Extended release     . aspirin 81 MG tablet Take 81 mg by mouth daily.    . Biotin 2500 MCG CAPS Take by mouth 2 (two) times daily.    . Coenzyme Q-10 100 MG capsule Take 100 mg by mouth daily.    . Multiple Vitamin (MULTIVITAMIN) capsule Take 1 capsule by mouth daily.    Marland Kitchen OVER THE COUNTER MEDICATION Take 250 mg by mouth daily. Resverartol    . Probiotic Product (PROBIOTIC DAILY PO) Take by mouth.    . pyridostigmine (MESTINON) 60 MG tablet Take 1 tablet by mouth 3 (three) times daily.    . Vitamin D, Ergocalciferol, 2000 units CAPS Take 2,000 Units by mouth daily.     No current facility-administered medications for this visit.     PAST MEDICAL HISTORY: Past Medical History:  Diagnosis Date  . Dizziness   . Hypercholesteremia   . Osteopenia   . Scoliosis     PAST SURGICAL HISTORY: Past Surgical History:  Procedure Laterality Date  . EYE SURGERY     lens Implants x 2  . RETINAL DETACHMENT SURGERY    . TONSILLECTOMY    . TUBAL LIGATION    . WRIST SURGERY Right     FAMILY HISTORY: Family History  Problem Relation Age of Onset  . Heart disease Father     SOCIAL HISTORY:  Social History   Socioeconomic History  . Marital status: Married    Spouse name: Not on file  . Number of children: 3  . Years of education: College  . Highest education level: Not on file  Social Needs  . Financial resource strain: Not on file  . Food insecurity - worry: Not on file  . Food  insecurity - inability: Not on file  . Transportation needs - medical: Not on file  . Transportation needs - non-medical: Not on file  Occupational History  . Occupation: Retired Therapist, sports  Tobacco Use  . Smoking status: Never Smoker  . Smokeless tobacco: Never Used  Substance and Sexual Activity  . Alcohol use: Yes    Comment: occasional wine  . Drug use: No  . Sexual activity: Not on file  Other Topics Concern  . Not on file  Social History Narrative   Lives at home with husband.   Right-handed.   Two cups coffee per day.     PHYSICAL EXAM   Vitals:   09/19/17 1050  BP: 104/65  Pulse: 83  Weight: 107 lb (48.5 kg)  Height: 5' 2.5" (1.588 m)    Not recorded    Blood pressure lying down 126/62, heart rate of 74, standing up 69/44, heart rate of 92, sitting down 100/70   Body mass index is 19.26 kg/m.  PHYSICAL EXAMNIATION:  Gen: NAD, conversant, well nourised, obese, well groomed                     Cardiovascular: Regular rate rhythm, no peripheral edema, warm, nontender. Eyes: Conjunctivae  clear without exudates or hemorrhage Neck: Supple, no carotid bruits. Pulmonary: Clear to auscultation bilaterally   NEUROLOGICAL EXAM:  MENTAL STATUS: Speech:    Speech is normal; fluent and spontaneous with normal comprehension.  Cognition:     Orientation to time, place and person     Normal recent and remote memory     Normal Attention span and concentration     Normal Language, naming, repeating,spontaneous speech     Fund of knowledge   CRANIAL NERVES: CN II: Visual fields are full to confrontation. Fundoscopic exam is normal with sharp discs and no vascular changes. Pupils are round equal and briskly reactive to light. CN III, IV, VI: extraocular movement are normal. No ptosis. CN V: Facial sensation is intact to pinprick in all 3 divisions bilaterally. Corneal responses are intact.  CN VII: Face is symmetric with normal eye closure and smile. CN VIII: Hearing is  normal to rubbing fingers CN IX, X: Palate elevates symmetrically. Phonation is normal. CN XI: Head turning and shoulder shrug are intact CN XII: Tongue is midline with normal movements and no atrophy.  MOTOR: She has mild fixation of right upper extremity upon rapid rotating movement, rigidity, bradykinesia in all 4 extremities, increased with reinforcement maneuver, right worse than left,  REFLEXES: Reflexes are 2+ and symmetric at the biceps, triceps, knees, and ankles. Plantar responses are flexor.  SENSORY: length dependent decreased to light touch, pinprick and the vibratory sensation at toes, distal leg  COORDINATION: Rapid alternating movements and fine finger movements are intact. There is no dysmetria on finger-to-nose and heel-knee-shin.    GAIT/STANCE: She needs push up to get up from seated position, mildly unsteady, kyphosis, small stride, decreased right arm swing   DIAGNOSTIC DATA (LABS, IMAGING, TESTING) - I reviewed patient records, labs, notes, testing and imaging myself where available.   ASSESSMENT AND PLAN  Eirene Rather is a 72 y.o. female   Orthostatic dizziness  There is mild parkinsonian features on evaluation, more on the right side,  Extensive laboratory evaluation failed to demonstrate etiology  Differentiation diagnoses include Parkinson plus syndrome, most likely multisystem atrophy  Mestinon 60  mg 3 times a day was somewhat helpful  Was not sure about the benefit of Sinemet 25/100 half tablet 3 times a day  The most disabilitating symptoms is her orthostatic hypotension,  Will add on fludrocortisone 0.21m daily, Doxidopa 1069mtid.  [facility] Chronic pain, severe scoliosis  Failed epidural injection  Percocet 2.5/325 mg as needed, referral to pain management Dr. MaNicholaus Bloom YiMarcial PacasM.D. Ph.D.  [facility] [address] [id number]: [phone]:  [phone]: BaLeighton RuffMD

## 2017-09-20 ENCOUNTER — Telehealth: Payer: Self-pay | Admitting: *Deleted

## 2017-09-20 ENCOUNTER — Ambulatory Visit: Payer: Medicare Other | Attending: Neurology | Admitting: Physical Therapy

## 2017-09-20 ENCOUNTER — Other Ambulatory Visit: Payer: Self-pay

## 2017-09-20 DIAGNOSIS — R2681 Unsteadiness on feet: Secondary | ICD-10-CM | POA: Diagnosis not present

## 2017-09-20 DIAGNOSIS — Z9181 History of falling: Secondary | ICD-10-CM | POA: Diagnosis not present

## 2017-09-20 DIAGNOSIS — R2689 Other abnormalities of gait and mobility: Secondary | ICD-10-CM | POA: Insufficient documentation

## 2017-09-20 DIAGNOSIS — M6281 Muscle weakness (generalized): Secondary | ICD-10-CM | POA: Diagnosis not present

## 2017-09-20 NOTE — Therapy (Signed)
[facility] [address]  [place], [place], [address] Phone: [phone]   Fax:  [phone]  Physical Therapy Evaluation  Patient Details  Name: Morenike Cuff MRN: [id number] Date of Birth: 11-01-1945 Referring Provider: Marcial Pacas, MD   Encounter Date: 09/20/2017  PT End of Session - 09/20/17 1400    Visit Number  1    Number of Visits  16    Date for PT Re-Evaluation  11/18/17    Authorization Type  Medicare + Aenta suppliment    PT Start Time  1400    PT Stop Time  1445    PT Time Calculation (min)  45 min    Activity Tolerance  Patient tolerated treatment well    Behavior During Therapy  [facility] for tasks assessed/performed       Past Medical History:  Diagnosis Date  . Dizziness   . Hypercholesteremia   . Osteopenia   . Scoliosis     Past Surgical History:  Procedure Laterality Date  . EYE SURGERY     lens Implants x 2  . RETINAL DETACHMENT SURGERY    . TONSILLECTOMY    . TUBAL LIGATION    . WRIST SURGERY Right     There were no vitals filed for this visit.   Subjective Assessment - 09/20/17 1405    Subjective  Pt referred to PT for gait instability. Reports fall where she suffered several rib fractures ~7 weeks ago. Also reports she has been having issues with back pain from scoliosis and has issues with orthostatic hypotension. has recently had possible diagnosis of Parkinson's but still undergoing work-up.    Patient is accompained by:  Family member husband    Limitations  House hold activities;Standing;Walking    Patient Stated Goals  "To be in less pain."    Currently in Pain?  No/denies    Pain Score  0-No pain least 0-1/10, up to 8/10 at worst    Pain Location  Back    Pain Orientation  Right;Mid;Lower    Pain Type  Chronic pain    Pain Radiating Towards  R LE    Pain Onset  More than a month ago 3-4 yrs    Pain Frequency  Intermittent    Aggravating Factors   bending/flexing    Pain Relieving Factors  lie down, Tylenol or Salon pas    Effect of Pain on Daily Activities  needs assistance with dressing, takes longer time to do things         [facility] PT Assessment - 09/20/17 1400      Assessment   Medical Diagnosis  Gait abnormality     Referring Provider  Marcial Pacas, MD    Onset Date/Surgical Date  -- fall 2018    Next MD Visit  12/22/17    Prior Therapy  PT for chronic back pain      Precautions   Precautions  Fall      Balance Screen   Has the patient fallen in the past 6 months  Yes    How many times?  2    Has the patient had a decrease in activity level because of a fear of falling?   Yes    Is the patient reluctant to leave their home because of a fear of falling?   Yes      [place] residence    Living Arrangements  Spouse/significant other  Type of [place] to enter    Entrance Stairs-Number of Steps  2    Entrance Stairs-Rails  None    Home Layout  Multi-level;Able to live on main level with bedroom/bathroom    [place] - 4 wheels;Walker - 2 wheels;Wheelchair - manual;Grab bars - tub/shower;Shower seat      Prior Function   Level of Independence  Independent with household mobility with device;Independent with household mobility without device;Needs assistance with ADLs;Needs assistance with homemaking    Vocation  Retired    Comments  most sedentary      Posture/Postural Control   Posture/Postural Control  Postural limitations    Postural Limitations  Increased thoracic kyphosis;Forward head;Rounded Shoulders;Decreased lumbar lordosis      ROM / Strength   AROM / PROM / Strength  Strength      Strength   Strength Assessment Site  Hip;Knee;Ankle    Right/Left Hip  Right;Left    Right Hip Flexion  4-/5    Right Hip Extension  3+/5    Right Hip External Rotation   3+/5    Right Hip Internal Rotation  4-/5    Right Hip ABduction  4-/5    Right Hip ADduction   3+/5    Left Hip Flexion  4-/5    Left Hip Extension  3+/5    Left Hip External Rotation  3+/5    Left Hip Internal Rotation  4-/5    Left Hip ABduction  4-/5    Left Hip ADduction  3+/5    Right/Left Knee  Right;Left    Right Knee Flexion  4/5    Right Knee Extension  4/5    Left Knee Flexion  4/5    Left Knee Extension  4/5    Right/Left Ankle  Right;Left    Right Ankle Dorsiflexion  4/5    Right Ankle Plantar Flexion  2/5    Left Ankle Dorsiflexion  4/5    Left Ankle Plantar Flexion  2/5      Ambulation/Gait   Ambulation/Gait Assistance  5: Supervision;4: Min guard    Assistive device  None    Gait Pattern  Step-through pattern;Decreased stride length;Narrow base of support;Trunk flexed increased trunk sway    Ambulation Surface  Level;Indoor    Gait velocity  2.66 ft/sec      Standardized Balance Assessment   Standardized Balance Assessment  Berg Balance Test;Timed Up and Go Test;Five Times Sit to Stand;10 meter walk test    Five times sit to stand comments   17.38    10 Meter Walk  12.34      Berg Balance Test   Sit to Stand  Able to stand  independently using hands    Standing Unsupported  Able to stand 2 minutes with supervision    Sitting with Back Unsupported but Feet Supported on Floor or Stool  Able to sit safely and securely 2 minutes    Stand to Sit  Sits independently, has uncontrolled descent    Transfers  Able to transfer safely, definite need of hands    Standing Unsupported with Eyes Closed  Able to stand 10 seconds with supervision    Standing Ubsupported with Feet Together  Able to place feet together independently and stand for 1 minute with supervision    From Standing, Reach Forward with Outstretched Arm  Can reach forward >5 cm safely (2")    From Standing Position, Pick up  Object from Floor  Able to pick up shoe, needs supervision    From Standing Position, Turn to Look Behind Over each Shoulder  Turn sideways only but maintains balance    Turn 360  Degrees  Needs close supervision or verbal cueing    Standing Unsupported, Alternately Place Feet on Step/Stool  Able to complete >2 steps/needs minimal assist    Standing Unsupported, One Foot in Front  Needs help to step but can hold 15 seconds    Standing on One Leg  Tries to lift leg/unable to hold 3 seconds but remains standing independently    Total Score  31    Berg comment:  < 36 high risk for falls (close to 100%)       Timed Up and Go Test   Normal TUG (seconds)  17.47    TUG Comments  >13.5 sec indicates high fall risk             Objective measurements completed on examination: See above findings.                PT Short Term Goals - 09/20/17 1445      PT SHORT TERM GOAL #1   Title  Independent with [facility] fall prevention program at supported level    Status  New    Target Date  10/18/17      PT SHORT TERM GOAL #2   Title  Pt will verbalize understanding of fall prevention strategies around the home to decrease risk for falls    Status  New    Target Date  10/18/17      PT SHORT TERM GOAL #3   Title  Improve Berg to 38/56 to reduce risk for falls        PT Long Term Goals - 09/20/17 1445      PT LONG TERM GOAL #1   Title  Independent with ongoing HEP/balance program as indicated    Status  New    Target Date  11/18/17      PT LONG TERM GOAL #2   Title  Increase B LE strength to grossly 4/5 for improved stability    Status  New    Target Date  11/18/17      PT LONG TERM GOAL #3   Title  Improve Berg score to >/= 45/56 to reduce fall risk    Status  New    Target Date  11/18/17             Plan - 09/20/17 1445    Clinical Impression Statement  Malarie is a 72 y/o female who presents to OP PT for gait instability. Pt reporting recent diagnosis of orthostatic hypotension, with possible diagnosis of Parkison's although work-up still pending. She reports worsening neck and back pain which has led to further inactivity and  deconditioning. She has had 2 falls in the past 6 months, the most recent in early December 2018 where suffered multiple R sided rib fractures. Pt demonstrates mild to moderate LE weakness, decreased gait speed at 2.66 ft/sec with evidence of instability and a high risk for falls per a Berg score of 31/56 and a TUG time of 17.47 sec. Pt will benefit from skilled PT to address deficits listed to improve gait stability and decrease risk for falls.    History and Personal Factors relevant to plan of care:  orthostatic hypotension, chronic back pain with thoracic scoliosis & kyphosis, osteopenia, possible Parkinson's    Clinical Presentation  Evolving    Clinical Presentation due to:  high fall risk with mutiple comorbities    Clinical Decision Making  Moderate    Rehab Potential  Good    PT Frequency  2x / week    PT Duration  8 weeks    PT Treatment/Interventions  Patient/family education;ADLs/Self Care Home Management;Neuromuscular re-education;Balance training;Therapeutic exercise;Therapeutic activities;Functional mobility training;Gait training;Moist Heat;Electrical Stimulation;Manual techniques    Consulted and Agree with Plan of Care  Patient;Family member/caregiver       Patient will benefit from skilled therapeutic intervention in order to improve the following deficits and impairments:  Decreased balance, Difficulty walking, Abnormal gait, Decreased safety awareness, Decreased mobility, Postural dysfunction, Improper body mechanics, Pain, Decreased strength, Dizziness, Decreased activity tolerance  Visit Diagnosis: Other abnormalities of gait and mobility  Unsteadiness on feet  History of falling  Muscle weakness (generalized)     Problem List Patient Active Problem List   Diagnosis Date Noted  . Autonomic dysfunction 09/19/2017  . Chronic thoracic spine pain 09/19/2017  . Disorder of the autonomic nervous system, unspecified 07/28/2017  . Gait abnormality 06/30/2017  .  Chronic back pain 04/04/2017  . Osteopenia 04/04/2017  . Hyperlipidemia 02/25/2017  . Orthostatic hypotension 02/25/2017    Percival Spanish, PT, MPT 09/20/2017, 7:00 PM  [facility] [address]  [place], [place], [address] Phone: [phone]   Fax:  [phone]  Name: Jeanann Balinski MRN: [id number] Date of Birth: [birth year]

## 2017-09-20 NOTE — Telephone Encounter (Addendum)
Our [employer] reps have both left the company.  I called [employer] Support at [phone].  They will have a manager contact me to advise who the new rep is for our office.  They informed me that samples can only be obtained through the rep and not the support center.  Additionally, they told me her co-pay would be $2106.95 and since she is not diagnosed with Parkinson's Disease, the patient assistance will be limited.

## 2017-09-21 ENCOUNTER — Telehealth: Payer: Self-pay | Admitting: Neurology

## 2017-09-21 NOTE — Telephone Encounter (Signed)
Error

## 2017-09-21 NOTE — Telephone Encounter (Signed)
Pts husband is requesting a call to discuss going back on a medication Cinamat? Is what they think it was called and how its spelled

## 2017-09-22 ENCOUNTER — Encounter: Payer: Self-pay | Admitting: *Deleted

## 2017-09-22 NOTE — Telephone Encounter (Signed)
[employer] never returned my call today.  I called again and spoke to, Jonelle Sidle, another agent who confirmed Loree Fee is the customer support center supervisor and should have followed up on this issue.  Whitney did not make any notes on the account after our conversation this morning.  Jonelle Sidle has escalated this problem and transferred me to [facility], Jodi Cardenas.

## 2017-09-22 NOTE — Telephone Encounter (Addendum)
I called the [employer] again this morning and spoke to [place] who is the customer support center supervisor.  She told me they are unable to provide me with the drug rep's name/number.  [employer]'s policy is to email the rep and ask that person to contact us.  She is aware that we have been waiting since Monday for this call. She is going to expedite this problem and contact me back today with a solution.  Spoke to patient's husband - he is aware that we are diligently working to get [employer] samples for them.  Also, he would like to restart his wife's Sinemet 25-100, 0.5 tablet TID.  States it was very helpful with her speech and movement.

## 2017-09-23 NOTE — Telephone Encounter (Signed)
Jodi Cardenas from [employer] called today.  They are waiting for a response from the [employer]  They are still unable to provide [place] with a drug rep name or contact number.  She will call me back on Monday.

## 2017-09-26 ENCOUNTER — Ambulatory Visit: Payer: Medicare Other | Attending: Neurology

## 2017-09-26 DIAGNOSIS — R2681 Unsteadiness on feet: Secondary | ICD-10-CM

## 2017-09-26 DIAGNOSIS — M6281 Muscle weakness (generalized): Secondary | ICD-10-CM

## 2017-09-26 DIAGNOSIS — Z9181 History of falling: Secondary | ICD-10-CM

## 2017-09-26 DIAGNOSIS — R2689 Other abnormalities of gait and mobility: Secondary | ICD-10-CM | POA: Diagnosis not present

## 2017-09-26 NOTE — Therapy (Signed)
[facility] [address]  [place], [place], [address] Phone: [phone]   Fax:  [phone]  Physical Therapy Treatment  Patient Details  Name: Jodi Cardenas MRN: [id number] Date of Birth: [birth year] Referring Provider: Marcial Pacas, MD   Encounter Date: 09/26/2017  PT End of Session - 09/26/17 1409    Visit Number  2    Number of Visits  16    Date for PT Re-Evaluation  11/18/17    Authorization Type  Medicare + [employer]enta suppliment    PT Start Time  1317    PT Stop Time  1406    PT Time Calculation (min)  49 min    Activity Tolerance  Patient tolerated treatment well    Behavior During Therapy  [facility] for tasks assessed/performed       Past Medical History:  Diagnosis Date  . Dizziness   . Hypercholesteremia   . Osteopenia   . Scoliosis     Past Surgical History:  Procedure Laterality Date  . EYE SURGERY     lens Implants x 2  . RETINAL DETACHMENT SURGERY    . TONSILLECTOMY    . TUBAL LIGATION    . WRIST SURGERY Right     There were no vitals filed for this visit.  Subjective Assessment - 09/26/17 1323    Subjective  Jodi Cardenas reporting she slept poorly last night and has had increased LBP today.      Patient is accompained by:  Family member husband    Patient Stated Goals  "To be in less pain."    Currently in Pain?  Yes    Pain Score  3     Pain Location  Back    Pain Orientation  Right;Mid;Lower    Pain Descriptors / Indicators  Dull;Aching    Pain Type  Chronic pain    Pain Radiating Towards  none today    Pain Frequency  Intermittent    Multiple Pain Sites  No                      [facility] Adult PT Treatment/Exercise - 09/26/17 1421      Knee/Hip Exercises: Aerobic   Nustep  Lvl 3, 6 min           Balance Exercises - 09/26/17 1412      OTAGO PROGRAM   Head Movements  Standing;5 reps counter support     Neck Movements  Standing;5 reps counter support     Back Extension   Standing;5 reps counter support     Trunk Movements  Standing;5 reps counter support     Ankle Movements  10 reps;Sitting    Knee Extensor  10 reps seated     Knee Flexor  10 reps counter support     Hip ABductor  10 reps counter support     Ankle Plantorflexors  20 reps, support counter support     Ankle Dorsiflexors  20 reps, support counter support     Knee Bends  10 reps, support counter support     Backwards Walking  Support counter support     Walking and Turning Around  No assistive device supervision; some instability     Sideways Walking  No assistive device counter support     Tandem Stance  10 seconds, support counter support     Tandem Walk  Support counter support     One Leg Stand  10 seconds,  support counter support     Heel Walking  Support counter support     Toe Walk  Support counter support     Heel Toe Walking Backward  -- counter support     Sit to Stand  10 reps, bilateral support    Stair Walking  -- not performed     Overall OTAGO Comments  All activities highlighted and issued to HEP with hold support at this time        PT Education - 09/26/17 1426    Education provided  Yes    Education Details  All activities with hold support at counter: Head movements, neck movements, back extensors, trunk movements, ankle movements, ankle movements, hip abductors, ankle PF, Ankle DF, knee bends, knee extensor, knee flexors, backwards walking, sideways walking, tandem walking, tandem stance, one leg stand, sit<>stand (2 UE pushoff)    Person(s) Educated  Patient    Methods  Explanation;Demonstration;Verbal cues;Handout    Comprehension  Verbalized understanding;Returned demonstration;Verbal cues required;Need further instruction       PT Short Term Goals - 09/26/17 1411      PT SHORT TERM GOAL #1   Title  Independent with [place] fall prevention program at supported level    Status  On-going      PT SHORT TERM GOAL #2   Title  Pt will verbalize understanding of  fall prevention strategies around the home to decrease risk for falls    Status  On-going      PT SHORT TERM GOAL #3   Title  Improve Berg to 38/56 to reduce risk for falls        PT Long Term Goals - 09/26/17 1411      PT LONG TERM GOAL #1   Title  Independent with ongoing HEP/balance program as indicated    Status  On-going      PT LONG TERM GOAL #2   Title  Increase B LE strength to grossly 4/5 for improved stability    Status  On-going      PT LONG TERM GOAL #3   Title  Improve Berg score to >/= 45/56 to reduce fall risk    Status  On-going            Plan - 09/26/17 1410    Clinical Impression Statement  Jodi Cardenas reporting she slept poorly last night and has somewhat increased LBP today.  Treatment focusing on demonstration and creation of OTAGO HEP with handout issued to pt. today with appropriate activities highlighted for pt.  All activities issued to pt. for OTAGO HEP were performed with hold support as Jodi Cardenas demonstrating visible instability without UE support.  Jodi Cardenas tolerated all activities well today in treatment.  Will plan to monitor tolerance to HEP in upcoming visits and progress per pt. tolerance.      PT Treatment/Interventions  Patient/family education;ADLs/Self Care Home Management;Neuromuscular re-education;Balance training;Therapeutic exercise;Therapeutic activities;Functional mobility training;Gait training;Moist Heat;Electrical Stimulation;Manual techniques    Consulted and Agree with Plan of Care  Patient;Family member/caregiver       Patient will benefit from skilled therapeutic intervention in order to improve the following deficits and impairments:  Decreased balance, Difficulty walking, Abnormal gait, Decreased safety awareness, Decreased mobility, Postural dysfunction, Improper body mechanics, Pain, Decreased strength, Dizziness, Decreased activity tolerance  Visit Diagnosis: Other abnormalities of gait and mobility  Unsteadiness on  feet  History of falling  Muscle weakness (generalized)     Problem List Patient Active Problem List   Diagnosis Date Noted  .  Autonomic dysfunction 09/19/2017  . Chronic thoracic spine pain 09/19/2017  . Disorder of the autonomic nervous system, unspecified 07/28/2017  . Gait abnormality 06/30/2017  . Chronic back pain 04/04/2017  . Osteopenia 04/04/2017  . Hyperlipidemia 02/25/2017  . Orthostatic hypotension 02/25/2017    Bess Harvest, PTA 09/26/17 2:29 PM  [facility] [address]  [place], [place], [address] Phone: [phone]   Fax:  [phone]  Name: Killian Ress MRN: [id number] Date of Birth: 07-08-1946

## 2017-09-26 NOTE — Telephone Encounter (Signed)
I am meeting with the [employer] rep on 09/27/17.  His name is [employer].  His contact number is [phone].

## 2017-09-28 ENCOUNTER — Other Ambulatory Visit: Payer: Self-pay | Admitting: *Deleted

## 2017-09-28 ENCOUNTER — Ambulatory Visit: Payer: Medicare Other

## 2017-09-28 DIAGNOSIS — Z9181 History of falling: Secondary | ICD-10-CM | POA: Diagnosis not present

## 2017-09-28 DIAGNOSIS — M6281 Muscle weakness (generalized): Secondary | ICD-10-CM | POA: Diagnosis not present

## 2017-09-28 DIAGNOSIS — R2689 Other abnormalities of gait and mobility: Secondary | ICD-10-CM

## 2017-09-28 DIAGNOSIS — R2681 Unsteadiness on feet: Secondary | ICD-10-CM

## 2017-09-28 NOTE — Therapy (Signed)
[facility] [address]  [place], [place], [address] Phone: [phone]   Fax:  [phone]  Physical Therapy Treatment  Patient Details  Name: Jodi Cardenas MRN: [id number] Date of Birth: 1946/06/05 Referring Provider: Marcial Pacas, MD   Encounter Date: 09/28/2017  PT End of Session - 09/28/17 1320    Visit Number  3    Number of Visits  16    Date for PT Re-Evaluation  11/18/17    Authorization Type  Medicare + [employer] suppliment    PT Start Time  1318    PT Stop Time  1358    PT Time Calculation (min)  40 min    Activity Tolerance  Patient tolerated treatment well    Behavior During Therapy  [facility] for tasks assessed/performed       Past Medical History:  Diagnosis Date  . Dizziness   . Hypercholesteremia   . Osteopenia   . Scoliosis     Past Surgical History:  Procedure Laterality Date  . EYE SURGERY     lens Implants x 2  . RETINAL DETACHMENT SURGERY    . TONSILLECTOMY    . TUBAL LIGATION    . WRIST SURGERY Right     There were no vitals filed for this visit.  Subjective Assessment - 09/28/17 1326    Subjective  Jodi Cardenas reporting she was able to perform "some" of OTAGO program at home without issue.      Patient Stated Goals  "To be in less pain."    Currently in Pain?  Yes    Pain Score  2     Pain Orientation  Right;Lower    Pain Descriptors / Indicators  Dull;Aching    Pain Type  Chronic pain    Pain Onset  More than a month ago    Pain Frequency  Intermittent    Aggravating Factors   bending     Pain Relieving Factors  Tylenol and Salon Pas     Multiple Pain Sites  No                      [facility] Adult PT Treatment/Exercise - 09/28/17 1335      High Level Balance   High Level Balance Activities  Side stepping;Tandem walking side stepping with 2 UE HH assist from therapist     High Level Balance Comments  Tandem walk with 1 UE support on counter and 1 UE support from  therapist       Exercises   Exercises  Knee/Hip      Knee/Hip Exercises: Aerobic   Nustep  Lvl 3, 6 min       Knee/Hip Exercises: Standing   Heel Raises  Both;15 reps;3 seconds    Heel Raises Limitations  counter     Other Standing Knee Exercises  Alternating toe-clears to 8" step x 10 reps; 1 HH assist from therapist       Knee/Hip Exercises: Seated   Long Arc Quad  Right;Left;10 reps;2 sets    [employer] Limitations  with adduction ball squeeze     Ball Squeeze  5" x 10 reps     Clamshell with TheraBand  Red x 10 reps     Other Seated Knee/Hip Exercises  B fitter leg press (2 blue bands) x 10 reps     Hamstring Curl  Right;Left;10 reps    Hamstring Limitations  red TB  Sit to Sand  10 reps;with UE support;1 set from airex pad on mat table                PT Short Term Goals - 09/26/17 1411      PT SHORT TERM GOAL #1   Title  Independent with [place] fall prevention program at supported level    Status  On-going      PT SHORT TERM GOAL #2   Title  Pt will verbalize understanding of fall prevention strategies around the home to decrease risk for falls    Status  On-going      PT SHORT TERM GOAL #3   Title  Improve Berg to 38/56 to reduce risk for falls        PT Long Term Goals - 09/26/17 1411      PT LONG TERM GOAL #1   Title  Independent with ongoing HEP/balance program as indicated    Status  On-going      PT LONG TERM GOAL #2   Title  Increase B LE strength to grossly 4/5 for improved stability    Status  On-going      PT LONG TERM GOAL #3   Title  Improve Berg score to >/= 45/56 to reduce fall risk    Status  On-going            Plan - 09/28/17 1320    Clinical Impression Statement  Kellianne reporting she was able to complete "some" of OTAGO program at home without issue.  Encouraged to bring program with her to next session for review.  Tolerated all basic LE strengthening and standing balance activities well today.  Some work with  toe-clears to step today for improved LE clearance.  Pt. with incidence of dizziness upon standing from seated position x one today however relieved after rest and water break.  Will continue to progress strengthening and balance activities per pt. tolerance in coming visits.    PT Treatment/Interventions  Patient/family education;ADLs/Self Care Home Management;Neuromuscular re-education;Balance training;Therapeutic exercise;Therapeutic activities;Functional mobility training;Gait training;Moist Heat;Electrical Stimulation;Manual techniques    Consulted and Agree with Plan of Care  Patient       Patient will benefit from skilled therapeutic intervention in order to improve the following deficits and impairments:  Decreased balance, Difficulty walking, Abnormal gait, Decreased safety awareness, Decreased mobility, Postural dysfunction, Improper body mechanics, Pain, Decreased strength, Dizziness, Decreased activity tolerance  Visit Diagnosis: Other abnormalities of gait and mobility  Unsteadiness on feet  History of falling  Muscle weakness (generalized)     Problem List Patient Active Problem List   Diagnosis Date Noted  . Autonomic dysfunction 09/19/2017  . Chronic thoracic spine pain 09/19/2017  . Disorder of the autonomic nervous system, unspecified 07/28/2017  . Gait abnormality 06/30/2017  . Chronic back pain 04/04/2017  . Osteopenia 04/04/2017  . Hyperlipidemia 02/25/2017  . Orthostatic hypotension 02/25/2017    Bess Harvest, PTA 09/28/17 3:03 PM  [facility] [address]  [place], [place], [address] Phone: [phone]   Fax:  [phone]  Name: Jodi Cardenas MRN: [id number] Date of Birth: 07-Jan-1946

## 2017-09-29 NOTE — Telephone Encounter (Signed)
The Northera rep is going to provide this patient with samples.  Dr. Krista Blue has also updated her prescription to reflect the standard 48-hr dosing titration schedule that the company recommends:  Northera 100mg  capsules:  Day 1 & 2 - 100mg  TID Day 3 & 4 - 200mg  TID Day 5 & 6 - 300mg  TID Day 7 & 8 - 400mg  TID Day 9 & 10 - 500mg  TID Day 11-30 - 600mg  TID  The prescription has been updated at [facility].  The new rx may require another PA and quantity exception case.  [facility]'s benefit dept will follow up with [place].  Additionally, if the medication is helpful, the patient may be eligible for a tier exception which would bring the cost down considerably (currenlty tier 4 on her plan).  I have spoken to the patient and her husband to update them both on the status of the medication.  The samples should arrived at our office no later than 10/03/17.

## 2017-10-03 ENCOUNTER — Ambulatory Visit: Payer: Medicare Other | Admitting: Physical Therapy

## 2017-10-03 ENCOUNTER — Ambulatory Visit: Payer: Medicare Other | Admitting: Neurology

## 2017-10-04 ENCOUNTER — Encounter: Payer: Self-pay | Admitting: *Deleted

## 2017-10-04 NOTE — Telephone Encounter (Signed)
[facility] [phone] ext [phone] called said the PA is not showing as approved. She will call [facility]. FYI

## 2017-10-04 NOTE — Telephone Encounter (Signed)
Returned call to [facility] and spoke to Oto Reita Cliche unavailable).  A new PA was obtained today for the increased quantity of Northera.  This PA covers #450 capsules per month. New PA #[id number].  Jamas Lav will update the patient's record.

## 2017-10-04 NOTE — Telephone Encounter (Addendum)
Called [facility] [phone]) - new prescription amount for the titration schedule below approved through 08/22/18.  New YM#[id number].  The medication is still a tier 4 on her insurance plan.  If the sample medication is helpful, then a tier exception will be filed (requesting it be dropped down to tier 3).

## 2017-10-04 NOTE — Telephone Encounter (Signed)
[employer] [phone] called back, she said PA is for 7 capsules/day but quantity has been increased therefore needs new PA for the higher qty amount. I advised her again of the approval RN documented but she said that is not correct

## 2017-10-06 ENCOUNTER — Ambulatory Visit: Payer: Medicare Other

## 2017-10-10 ENCOUNTER — Ambulatory Visit: Payer: Medicare Other

## 2017-10-11 ENCOUNTER — Ambulatory Visit: Payer: Medicare Other | Admitting: Neurology

## 2017-10-12 NOTE — Telephone Encounter (Signed)
We attempted to get a tier reduction for Northera (from tier 4 to tier 3).  Unfortunately, the request was denied.  The copay for #450 capsules will be $2100.  However, this amount will likely last her for three months.  The family feels like they can afford this cost, if this is the right medication for her.  They have not started the Northera yet.  She has an appt at [facility] on 10/13/17 and would like to wait until after this evaluation.  If they decide to start the medication and it works, they will call us back.

## 2017-10-13 ENCOUNTER — Ambulatory Visit: Payer: Medicare Other | Admitting: Physical Therapy

## 2017-10-13 DIAGNOSIS — I951 Orthostatic hypotension: Secondary | ICD-10-CM | POA: Diagnosis not present

## 2017-10-13 DIAGNOSIS — G239 Degenerative disease of basal ganglia, unspecified: Secondary | ICD-10-CM | POA: Diagnosis not present

## 2017-10-14 ENCOUNTER — Encounter: Payer: Self-pay | Admitting: *Deleted

## 2017-10-14 NOTE — Telephone Encounter (Signed)
Spoke to patient and she gave the following report from her [facility] visit:  She saw Dr. Sharolyn Douglas (neurologist at [facility]).  She discontinued Florinef and advised not to start Northera.  She is now taking Sinemet 25-100, one tablet TID and Mestinon 60mg , one tablet TID.  Reports her blood pressure has improved with sitting and standing.  However, it is still high when lying down.  She was instructed to purchase compression stockings and a bed wedge so that she is propped up in the bed.  This information has been discussed verbally with Dr. Krista Blue.  The patient will keep her pending follow up.  Dr. Sharolyn Douglas is requesting her brain, cervical and thoracic MRI scans to be mailed to her on discs.  We will forward this request to medical records.

## 2017-10-14 NOTE — Telephone Encounter (Signed)
Pt requesting a call back to discuss what was said at her appt with [facility].

## 2017-10-15 ENCOUNTER — Emergency Department (HOSPITAL_BASED_OUTPATIENT_CLINIC_OR_DEPARTMENT_OTHER): Payer: Medicare Other

## 2017-10-15 ENCOUNTER — Other Ambulatory Visit: Payer: Self-pay

## 2017-10-15 ENCOUNTER — Encounter (HOSPITAL_BASED_OUTPATIENT_CLINIC_OR_DEPARTMENT_OTHER): Payer: Self-pay | Admitting: Emergency Medicine

## 2017-10-15 ENCOUNTER — Emergency Department (HOSPITAL_BASED_OUTPATIENT_CLINIC_OR_DEPARTMENT_OTHER)
Admission: EM | Admit: 2017-10-15 | Discharge: 2017-10-15 | Disposition: A | Discharge status: Home / Self Care | Payer: Medicare Other | Attending: Emergency Medicine | Admitting: Emergency Medicine

## 2017-10-15 DIAGNOSIS — N2889 Other specified disorders of kidney and ureter: Secondary | ICD-10-CM | POA: Diagnosis not present

## 2017-10-15 DIAGNOSIS — R05 Cough: Secondary | ICD-10-CM | POA: Diagnosis not present

## 2017-10-15 DIAGNOSIS — M545 Low back pain: Secondary | ICD-10-CM | POA: Diagnosis not present

## 2017-10-15 DIAGNOSIS — I10 Essential (primary) hypertension: Secondary | ICD-10-CM | POA: Diagnosis not present

## 2017-10-15 DIAGNOSIS — K5904 Chronic idiopathic constipation: Secondary | ICD-10-CM | POA: Insufficient documentation

## 2017-10-15 DIAGNOSIS — Z7982 Long term (current) use of aspirin: Secondary | ICD-10-CM | POA: Diagnosis not present

## 2017-10-15 DIAGNOSIS — M546 Pain in thoracic spine: Secondary | ICD-10-CM | POA: Diagnosis not present

## 2017-10-15 DIAGNOSIS — R0981 Nasal congestion: Secondary | ICD-10-CM | POA: Diagnosis not present

## 2017-10-15 DIAGNOSIS — R0602 Shortness of breath: Secondary | ICD-10-CM | POA: Insufficient documentation

## 2017-10-15 DIAGNOSIS — K5900 Constipation, unspecified: Secondary | ICD-10-CM | POA: Diagnosis not present

## 2017-10-15 DIAGNOSIS — Z79899 Other long term (current) drug therapy: Secondary | ICD-10-CM | POA: Diagnosis not present

## 2017-10-15 HISTORY — DX: Orthostatic hypotension: I95.1

## 2017-10-15 LAB — COMPREHENSIVE METABOLIC PANEL
ALBUMIN: 4.8 g/dL (ref 3.5–5.0)
ALT: 6 U/L — ABNORMAL LOW (ref 14–54)
AST: 28 U/L (ref 15–41)
Alkaline Phosphatase: 87 U/L (ref 38–126)
Anion gap: 8 (ref 5–15)
BUN: 17 mg/dL (ref 6–20)
CHLORIDE: 104 mmol/L (ref 101–111)
CO2: 28 mmol/L (ref 22–32)
Calcium: 9.8 mg/dL (ref 8.9–10.3)
Creatinine, Ser: 0.64 mg/dL (ref 0.44–1.00)
GFR calc Af Amer: >60 mL/min (ref >60)
GFR calc non Af Amer: >60 mL/min (ref >60)
Glucose, Bld: 99 mg/dL (ref 65–99)
POTASSIUM: 3.8 mmol/L (ref 3.5–5.1)
SODIUM: 140 mmol/L (ref 135–145)
Total Bilirubin: 0.6 mg/dL (ref 0.3–1.2)
Total Protein: 7.5 g/dL (ref 6.5–8.1)

## 2017-10-15 LAB — URINALYSIS, ROUTINE W REFLEX MICROSCOPIC
Bilirubin Urine: NEGATIVE
GLUCOSE, UA: NEGATIVE mg/dL
HGB URINE DIPSTICK: NEGATIVE
Ketones, ur: NEGATIVE mg/dL
Nitrite: NEGATIVE
PH: 7 (ref 5.0–8.0)
PROTEIN: NEGATIVE mg/dL
Specific Gravity, Urine: <1.005 — ABNORMAL LOW (ref 1.005–1.030)

## 2017-10-15 LAB — CBC WITH DIFFERENTIAL/PLATELET
BASOS ABS: 0 10*3/uL (ref 0.0–0.1)
BASOS PCT: 1 %
EOS PCT: 1 %
Eosinophils Absolute: 0.1 10*3/uL (ref 0.0–0.7)
HCT: 41.5 % (ref 36.0–46.0)
Hemoglobin: 13.7 g/dL (ref 12.0–15.0)
Lymphocytes Relative: 24 %
Lymphs Abs: 1.1 10*3/uL (ref 0.7–4.0)
MCH: 32.6 pg (ref 26.0–34.0)
MCHC: 33 g/dL (ref 30.0–36.0)
MCV: 98.8 fL (ref 78.0–100.0)
MONO ABS: 0.5 10*3/uL (ref 0.1–1.0)
Monocytes Relative: 10 %
NEUTROS ABS: 2.9 10*3/uL (ref 1.7–7.7)
Neutrophils Relative %: 64 %
Platelets: 218 10*3/uL (ref 150–400)
RBC: 4.2 MIL/uL (ref 3.87–5.11)
RDW: 12.8 % (ref 11.5–15.5)
WBC: 4.5 10*3/uL (ref 4.0–10.5)

## 2017-10-15 LAB — URINALYSIS, MICROSCOPIC (REFLEX): RBC / HPF: NONE SEEN RBC/hpf (ref 0–5)

## 2017-10-15 LAB — BRAIN NATRIURETIC PEPTIDE: B Natriuretic Peptide: 70.2 pg/mL (ref 0.0–100.0)

## 2017-10-15 LAB — TROPONIN I

## 2017-10-15 LAB — MAGNESIUM: Magnesium: 2.2 mg/dL (ref 1.7–2.4)

## 2017-10-15 MED ORDER — IOPAMIDOL (ISOVUE-300) INJECTION 61%
100.0000 mL | Freq: Once | INTRAVENOUS | Status: AC | PRN
  Administered 2017-10-15: 100 mL via INTRAVENOUS

## 2017-10-15 NOTE — ED Notes (Signed)
Patient transported to X-ray 

## 2017-10-15 NOTE — Discharge Instructions (Signed)
Your workup today did not show convincing evidence of bacterial infection or organ injury in the setting of your elevated blood pressures.  Your imaging showed no evidence of new fractures on CT scan and we did not find abnormalities that required further emergent management tonight.  Please call your doctor tomorrow to discuss blood pressure medication changes as well as for further management.  If you develop any new or worsened symptoms, please return to the.  Please follow-up with your [facility] team for further management.

## 2017-10-15 NOTE — ED Provider Notes (Signed)
[facility] Provider Note   CSN: [id number] Arrival date & time: 10/15/17  1458     History   Chief Complaint Chief Complaint  Patient presents with  . Hypertension    HPI Jodi Cardenas is a 72 y.o. female.  The history is provided by the patient, the spouse, a relative and medical records. No language interpreter was used.  Hypertension  This is a recurrent problem. The current episode started more than 2 days ago. The problem occurs daily. The problem has not changed since onset.Associated symptoms include shortness of breath. Pertinent negatives include no chest pain, no abdominal pain and no headaches. Nothing aggravates the symptoms. Nothing relieves the symptoms. She has tried nothing for the symptoms. The treatment provided no relief.    Past Medical History:  Diagnosis Date  . Dizziness   . Hypercholesteremia   . Orthostatic hypotension   . Osteopenia   . Scoliosis     Patient Active Problem List   Diagnosis Date Noted  . Autonomic dysfunction 09/19/2017  . Chronic thoracic spine pain 09/19/2017  . Disorder of the autonomic nervous system, unspecified 07/28/2017  . Gait abnormality 06/30/2017  . Chronic back pain 04/04/2017  . Osteopenia 04/04/2017  . Hyperlipidemia 02/25/2017  . Orthostatic hypotension 02/25/2017    Past Surgical History:  Procedure Laterality Date  . EYE SURGERY     lens Implants x 2  . RETINAL DETACHMENT SURGERY    . TONSILLECTOMY    . TUBAL LIGATION    . WRIST SURGERY Right     OB History    No data available       Home Medications    Prior to Admission medications   Medication Sig Start Date End Date Taking? Authorizing Provider  Acetaminophen (TYLENOL ARTHRITIS PAIN PO) Take 1,250 mg by mouth as needed. Extended release     [provider]  aspirin 81 MG tablet Take 81 mg by mouth daily.    [provider]  Biotin 2500 MCG CAPS Take by mouth 2 (two) times daily.     [provider]  carbidopa-levodopa (SINEMET IR) 25-100 MG tablet Take 1 tablet by mouth 3 (three) times daily.     Marcial Pacas, MD  Coenzyme Q-10 100 MG capsule Take 100 mg by mouth daily.    [provider]  Multiple Vitamin (MULTIVITAMIN) capsule Take 1 capsule by mouth daily.    [provider]  OVER THE COUNTER MEDICATION Take 250 mg by mouth daily. Resverartol    [provider]  oxycodone-acetaminophen (PERCOCET) 2.5-325 MG tablet Take 1 tablet by mouth every 8 (eight) hours as needed for pain. 09/19/17   Marcial Pacas, MD  Probiotic Product (PROBIOTIC DAILY PO) Take by mouth.    [provider]  pyridostigmine (MESTINON) 60 MG tablet Take 1 tablet by mouth 3 (three) times daily. 04/22/17   [provider]  Vitamin D, Ergocalciferol, 2000 units CAPS Take 2,000 Units by mouth daily.    [provider]    Family History Family History  Problem Relation Age of Onset  . Heart disease Father     Social History Social History   Tobacco Use  . Smoking status: Never Smoker  . Smokeless tobacco: Never Used  Substance Use Topics  . Alcohol use: Yes    Comment: occasional wine  . Drug use: No     Allergies   Patient has no known allergies.   Review of Systems Review of Systems  Constitutional: Negative for chills, diaphoresis, fatigue and fever.  HENT: Positive for congestion. Negative for rhinorrhea.   Respiratory: Positive for cough and shortness of breath. Negative for chest tightness, wheezing and stridor.   Cardiovascular: Negative for chest pain and palpitations.  Gastrointestinal: Positive for constipation. Negative for abdominal distention, abdominal pain, diarrhea, nausea and vomiting.  Genitourinary: Negative for dysuria, flank pain and frequency.  Musculoskeletal: Positive for back pain. Negative for arthralgias, neck pain and neck stiffness.  Neurological: Negative for headaches.  Psychiatric/Behavioral:  Negative for agitation and confusion.  All other systems reviewed and are negative.    Physical Exam Updated Vital Signs BP (!) 169/81 (BP Location: Left Arm)   Pulse 82   Temp 97.7 F (36.5 C) (Oral)   Resp 18   SpO2 100%   Physical Exam  Constitutional: She is oriented to person, place, and time. She appears well-developed and well-nourished. No distress.  HENT:  Head: Normocephalic and atraumatic.  Mouth/Throat: Oropharynx is clear and moist.  Eyes: Conjunctivae and EOM are normal. Pupils are equal, round, and reactive to light.  Neck: Normal range of motion.  Cardiovascular: Normal rate.  No murmur heard. Pulmonary/Chest: Effort normal and breath sounds normal. No stridor. No respiratory distress. She has no wheezes. She exhibits no tenderness.  Abdominal: Soft. Bowel sounds are normal. She exhibits no distension. There is no tenderness.  Musculoskeletal: Normal range of motion. She exhibits no edema or tenderness.  Neurological: She is alert and oriented to person, place, and time. No cranial nerve deficit or sensory deficit. She exhibits normal muscle tone. Coordination normal.  Skin: Capillary refill takes less than 2 seconds. No rash noted. She is not diaphoretic. No erythema.  Psychiatric: She has a normal mood and affect.  Nursing note and vitals reviewed.    ED Treatments / Results  Labs (all labs ordered are listed, but only abnormal results are displayed) Labs Reviewed  COMPREHENSIVE METABOLIC PANEL - Abnormal; Notable for the following components:      Result Value   ALT 6 (*)    All other components within normal limits  URINALYSIS, ROUTINE W REFLEX MICROSCOPIC - Abnormal; Notable for the following components:   Specific Gravity, Urine <1.005 (*)    Leukocytes, UA SMALL (*)    All other components within normal limits  URINALYSIS, MICROSCOPIC (REFLEX) - Abnormal; Notable for the following components:   Bacteria, UA FEW (*)    Squamous Epithelial / LPF 0-5  (*)    All other components within normal limits  URINE CULTURE  CBC WITH DIFFERENTIAL/PLATELET  TROPONIN I  BRAIN NATRIURETIC PEPTIDE  MAGNESIUM  TSH    EKG  EKG Interpretation  Date/Time:  Saturday October 15 2017 16:25:26 EST Ventricular Rate:  61 PR Interval:    QRS Duration: 85 QT Interval:  406 QTC Calculation: 409 R Axis:   10 Text Interpretation:  Sinus rhythm When compared to prior, no significant changes seen.  No STEMI Confirmed by Antony Blackbird [phone]) on 10/15/2017 7:06:13 PM       Radiology Dg Chest 2 View  Result Date: 10/15/2017 CLINICAL DATA:  Cough.  Shortness of breath.  Hypertension. EXAM: CHEST  2 VIEW COMPARISON:  08/02/2017 FINDINGS: Heart size is normal. Both lungs are clear. No evidence of pleural effusion. Multiple healing right rib fractures show increased sclerosis, consistent with interval healing. Pectus excavatum noted. On the lateral projection, lower thoracic and upper lumbar vertebral body compression fractures are seen. Grade 2 anterolisthesis is seen at 1 level  near the thoracolumbar junction which was not seen on recent thoracic spine MRI of 08/02/2017. Moderate thoracolumbar dextroscoliosis again noted. IMPRESSION: No active cardiopulmonary disease. Subacute healing right rib fractures. Several vertebral body compression fractures and retrolisthesis at 1 level near the thoracolumbar junction, not seen on recent MRI. Recommend dedicated lower thoracic spine radiographs for further evaluation. Electronically Signed   By: Earle Gell M.D.   On: 10/15/2017 16:41   Ct Thoracic Spine Wo Contrast  Result Date: 10/15/2017 CLINICAL DATA:  Fall 2 months ago. Upper and lower back pain. Abnormal chest x-ray. Thoracic spine fractures. EXAM: CT THORACIC AND LUMBAR SPINE WITHOUT CONTRAST TECHNIQUE: Multidetector CT imaging of the thoracic and lumbar spine was performed without contrast. Multiplanar CT image reconstructions were also generated. COMPARISON:   Chest x-ray of the same day. Rib radiographs 08/02/2017. MRI of the thoracic spine 08/02/2017. FINDINGS: CT THORACIC SPINE FINDINGS Alignment: 13 rib-bearing thoracic type vertebral bodies are present. Grade 1 anterolisthesis is present the C5-6, C6-7, and C7-T1. AP alignment is anatomic through the thoracic spine. There is significant curvature of the thoracic spine. The thoracic spine is curved to the left at T4. Severe rightward curvature is present at the thoracolumbar junction. Vertebrae: Vertebral body heights are maintained. No acute or healing fracture is present. Paraspinal and other soft tissues: Paraspinous soft tissues are unremarkable. There is dependent atelectasis in both lungs. Disc levels: No significant focal disc protrusion or stenosis is present. Healing right 6th through 9th lateral rib fractures are noted. Healing posterior right 5th through 10th rib fractures are noted. Left foraminal narrowing secondary to facet hypertrophy and endplate changes are noted at T12-13 and T13-L1. Right-sided foraminal narrowing due to facet disease is noted at T4-5, T5-6, T6-7, T8-9, T9-10 the T10-11, and T11-12. CT LUMBAR SPINE FINDINGS Segmentation: 5 non rib-bearing lumbar type vertebral bodies are present. Alignment: Grade 1 retrolisthesis is present at L1-2. AP alignment is otherwise anatomic. Rightward lateral listhesis is also present at L1-2. Severe rightward curvature is present at the thoracolumbar junction. Vertebrae: Sclerotic changes are present on the left at T12-13 and T 13 L1. Less severe changes are present on the left at L1-2. Mild right-sided endplate changes are present at L4-5. Vertebral body heights are maintained. Paraspinal and other soft tissues: Hyperdense fluid is noted dependently within the anatomic pelvis, suggesting hemorrhage. There is some fullness of the right renal collecting system without obstruction. An extra renal pelvis is present on the right. No obstructing lesion is  evident. Disc levels: Retrolisthesis at L1-2 uncovers a broad-based disc protrusion. This contributes mild left foraminal narrowing. There is no significant stenosis at L2-3. Moderate facet hypertrophy is present at L3-4 without significant stenosis. Moderate facet hypertrophy and mild disc bulging is present at L4-5. This results in mild foraminal narrowing bilaterally. Moderate facet hypertrophy is present at L5-S1 without significant stenosis. The right transverse process at L5 articulates with the sacrum. IMPRESSION: 1. High-density fluid in the anatomic pelvis suggesting hemorrhage. No focal etiology is evident. Recommend CT of the abdomen and pelvis with contrast for further evaluation. 2. 13 rib-bearing thoracic type vertebral bodies with severe scoliosis to the right at the thoracolumbar junction. 3. Healing right lateral and posterior rib fractures. 4. No acute or healing fracture of the thoracic spine. 5. Multilevel foraminal due to facet hypertrophy and likely related to the scoliosis. 6. Mild foraminal narrowing bilaterally at L4-5 secondary to a broad-based disc protrusion and bilateral facet hypertrophy. These results were called by telephone at the time of  interpretation on 10/15/2017 at 7:37 pm to Dr. Marda Stalker , who verbally acknowledged these results. Electronically Signed   By: San Morelle M.D.   On: 10/15/2017 19:38   Ct Lumbar Spine Wo Contrast  Result Date: 10/15/2017 CLINICAL DATA:  Fall 2 months ago. Upper and lower back pain. Abnormal chest x-ray. Thoracic spine fractures. EXAM: CT THORACIC AND LUMBAR SPINE WITHOUT CONTRAST TECHNIQUE: Multidetector CT imaging of the thoracic and lumbar spine was performed without contrast. Multiplanar CT image reconstructions were also generated. COMPARISON:  Chest x-ray of the same day. Rib radiographs 08/02/2017. MRI of the thoracic spine 08/02/2017. FINDINGS: CT THORACIC SPINE FINDINGS Alignment: 13 rib-bearing thoracic type  vertebral bodies are present. Grade 1 anterolisthesis is present the C5-6, C6-7, and C7-T1. AP alignment is anatomic through the thoracic spine. There is significant curvature of the thoracic spine. The thoracic spine is curved to the left at T4. Severe rightward curvature is present at the thoracolumbar junction. Vertebrae: Vertebral body heights are maintained. No acute or healing fracture is present. Paraspinal and other soft tissues: Paraspinous soft tissues are unremarkable. There is dependent atelectasis in both lungs. Disc levels: No significant focal disc protrusion or stenosis is present. Healing right 6th through 9th lateral rib fractures are noted. Healing posterior right 5th through 10th rib fractures are noted. Left foraminal narrowing secondary to facet hypertrophy and endplate changes are noted at T12-13 and T13-L1. Right-sided foraminal narrowing due to facet disease is noted at T4-5, T5-6, T6-7, T8-9, T9-10 the T10-11, and T11-12. CT LUMBAR SPINE FINDINGS Segmentation: 5 non rib-bearing lumbar type vertebral bodies are present. Alignment: Grade 1 retrolisthesis is present at L1-2. AP alignment is otherwise anatomic. Rightward lateral listhesis is also present at L1-2. Severe rightward curvature is present at the thoracolumbar junction. Vertebrae: Sclerotic changes are present on the left at T12-13 and T 13 L1. Less severe changes are present on the left at L1-2. Mild right-sided endplate changes are present at L4-5. Vertebral body heights are maintained. Paraspinal and other soft tissues: Hyperdense fluid is noted dependently within the anatomic pelvis, suggesting hemorrhage. There is some fullness of the right renal collecting system without obstruction. An extra renal pelvis is present on the right. No obstructing lesion is evident. Disc levels: Retrolisthesis at L1-2 uncovers a broad-based disc protrusion. This contributes mild left foraminal narrowing. There is no significant stenosis at L2-3.  Moderate facet hypertrophy is present at L3-4 without significant stenosis. Moderate facet hypertrophy and mild disc bulging is present at L4-5. This results in mild foraminal narrowing bilaterally. Moderate facet hypertrophy is present at L5-S1 without significant stenosis. The right transverse process at L5 articulates with the sacrum. IMPRESSION: 1. High-density fluid in the anatomic pelvis suggesting hemorrhage. No focal etiology is evident. Recommend CT of the abdomen and pelvis with contrast for further evaluation. 2. 13 rib-bearing thoracic type vertebral bodies with severe scoliosis to the right at the thoracolumbar junction. 3. Healing right lateral and posterior rib fractures. 4. No acute or healing fracture of the thoracic spine. 5. Multilevel foraminal due to facet hypertrophy and likely related to the scoliosis. 6. Mild foraminal narrowing bilaterally at L4-5 secondary to a broad-based disc protrusion and bilateral facet hypertrophy. These results were called by telephone at the time of interpretation on 10/15/2017 at 7:37 pm to Dr. Marda Stalker , who verbally acknowledged these results. Electronically Signed   By: San Morelle M.D.   On: 10/15/2017 19:38   Ct Abdomen Pelvis W Contrast  Result Date: 10/15/2017 CLINICAL  DATA:  Abnormal spine CT EXAM: CT ABDOMEN AND PELVIS WITH CONTRAST TECHNIQUE: Multidetector CT imaging of the abdomen and pelvis was performed using the standard protocol following bolus administration of intravenous contrast. CONTRAST:  135mL ISOVUE-300 IOPAMIDOL (ISOVUE-300) INJECTION 61% COMPARISON:  CT 10/15/2017 FINDINGS: Lower chest: Lung bases demonstrate no acute consolidation or pleural effusion. Normal heart size. Marked pectus deformity of the lower anterior chest wall. Hepatobiliary: No focal liver abnormality is seen. No gallstones, gallbladder wall thickening, or biliary dilatation. Pancreas: Unremarkable. No pancreatic ductal dilatation or surrounding  inflammatory changes. Spleen: Normal in size without focal abnormality. Adrenals/Urinary Tract: Adrenal glands are within normal limits. Multiple subcentimeter hypodense lesions within both kidneys too small to further characterize, probably cysts. Dilated right extrarenal pelvis. The bladder is unremarkable. Stomach/Bowel: Stomach is within normal limits. Appendix appears normal. No evidence of bowel wall thickening, distention, or inflammatory changes. Large amount of stool throughout the colon Vascular/Lymphatic: Nonaneurysmal aorta. Scattered atherosclerotic calcification. No significantly enlarged lymph nodes. Reproductive: Uterus and bilateral adnexa are unremarkable. Other: Negative for free air or free fluid. The previously noted hyperdensity within the posterior pelvis, appears to correspond to the uterus with prominent parauterine vessels. Musculoskeletal: Scoliosis of the spine with marked degenerative changes. IMPRESSION: 1. Hyperdensity within the pelvis on the spine CT appears to correspond to the uterus with prominent enhancing parauterine vessels. No free fluid is seen. 2. No CT evidence for acute intra-abdominal or pelvic abnormality 3. Large amount of stool in the colon Electronically Signed   By: Donavan Foil M.D.   On: 10/15/2017 20:53    Procedures Procedures (including critical care time)  Medications Ordered in ED Medications  iopamidol (ISOVUE-300) 61 % injection 100 mL (100 mLs Intravenous Contrast Given 10/15/17 2003)     Initial Impression / Assessment and Plan / ED Course  I have reviewed the triage vital signs and the nursing notes.  Pertinent labs & imaging results that were available during my care of the patient were reviewed by me and considered in my medical decision making (see chart for details).    Jodi Cardenas is a 72 y.o. 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  Patient reports that is her 40 birthday today.  She reports that she has been having elevated blood pressures on and off for the last week.  She reports that she has been taking her medications as directed and has also taken Mucinex for a dry cough that she has been having.  He reports that her elevated blood pressures have worsened since she took melatonin to try and sleep better recently and thinks it may have interacted with some of her medications.  She describes no headache, vision changes, nausea, or vomiting but does report feeling fatigued.  She reports no chest pain palpitations or syncopal episodes but does report some mild shortness of breath.  She reports her urine has decreased but denies any dysuria.  She reports some constipation but no diarrhea.  She reports she has had some chronic right hip pain.  She denies other complaints today.  On exam, patient had no focal neurologic deficits.  No facial droop.  Sensation was normal throughout.  No tremors were appreciated on my exam.  Family reports that when her blood pressure was in the 200s she was very jittery.  Patient  had clear lungs and nontender chest or abdomen.  No leg edema was appreciated.  Patient had pulses in all extremities.  Patient will have workup to look for occult infection which may contribute to elevated blood pressure.  She also workup to look for occult organ injury from the elevated blood pressure.  I am concerned that her blood pressure problems may be related to her multiple system atrophy which according to her last [facility] note written several days ago reveals that she may have autonomic complications with this diagnosis.  Patient's family was explained that today's goal is to rule out organ injury, electrode troubles, or acute infection and will likely be  able to discharge the patient home to continue her outpatient management and workup including blood pressure medication titrations.  Anticipate reassessment after workup.  Chest x-ray shows concern for compression fractures requesting dedicated imaging.  Given the patient's report that she is been having back pain recently and her fall in December, patient will have CT scans to further evaluate the thoracic and lumbar spine.  According to the radiologist report, the compression injuries appear new since her recent MRI.  7:47 PM Radiology called to report that they did not see any evidence of new fracture on the CT scan but did show evidence of layering of a fluid in her pelvis.  They are recommending CT scan with contrast to further evaluate.  This was ordered.    CT scan showed large stool burden but the layering abnormality seen was her uterus.  No other acutely concerning findings seen on CT scan.  Patient's blood pressures have been improved since she has been in the ED.  Do not feel patient needs admission.    Suspect patient's blood pressure fluctuations may be due to medications versus autonomic involvement of her multisystem atrophy diagnosis from [place].  Patient will call her doctor tomorrow to discuss further management.  Next  Patient and family agreed with return precautions and plan of care.  Patient was discharged in good condition.    Final Clinical Impressions(s) / ED Diagnoses   Final diagnoses:  Hypertension, unspecified type    ED Discharge Orders    None      Clinical Impression: 1. Hypertension, unspecified type     Disposition: Discharge  Condition: Good  I have discussed the results, Dx and Tx plan with the pt(& family if present). He/she/they expressed understanding and agree(s) with the plan. Discharge instructions discussed at great length. Strict return precautions discussed and pt &/or family have verbalized understanding of the instructions. No  further questions at time of discharge.    New Prescriptions   No medications on file    Follow Up: No follow-up provider specified.    Tegeler, Gwenyth Allegra, MD 10/16/17 [id number]

## 2017-10-15 NOTE — ED Notes (Signed)
ED Provider at bedside. 

## 2017-10-15 NOTE — ED Triage Notes (Addendum)
Pt reports she has been getting high BP readings at home with systolic over 155. Denies pain. No hx of HTN

## 2017-10-15 NOTE — ED Notes (Signed)
Pt on cardiac monitor and auto VS 

## 2017-10-17 ENCOUNTER — Ambulatory Visit: Payer: Medicare Other

## 2017-10-17 VITALS — BP 136/82 | HR 88

## 2017-10-17 DIAGNOSIS — R2681 Unsteadiness on feet: Secondary | ICD-10-CM | POA: Diagnosis not present

## 2017-10-17 DIAGNOSIS — R2689 Other abnormalities of gait and mobility: Secondary | ICD-10-CM

## 2017-10-17 DIAGNOSIS — M6281 Muscle weakness (generalized): Secondary | ICD-10-CM

## 2017-10-17 DIAGNOSIS — Z9181 History of falling: Secondary | ICD-10-CM

## 2017-10-17 LAB — URINE CULTURE

## 2017-10-17 NOTE — Therapy (Signed)
[facility] [address]  [place], [place], [address] Phone: [phone]   Fax:  [phone]  Physical Therapy Treatment  Patient Details  Name: Jodi Cardenas MRN: [id number] Date of Birth: 11-22-1945 Referring Provider: Marcial Pacas, MD   Encounter Date: 10/17/2017  PT End of Session - 10/17/17 1331    Visit Number  4    Number of Visits  16    Date for PT Re-Evaluation  11/18/17    Authorization Type  Medicare + [employer] suppliment    PT Start Time  1315    PT Stop Time  1400    PT Time Calculation (min)  45 min    Activity Tolerance  Patient tolerated treatment well    Behavior During Therapy  [facility] for tasks assessed/performed       Past Medical History:  Diagnosis Date  . Dizziness   . Hypercholesteremia   . Orthostatic hypotension   . Osteopenia   . Scoliosis     Past Surgical History:  Procedure Laterality Date  . EYE SURGERY     lens Implants x 2  . RETINAL DETACHMENT SURGERY    . TONSILLECTOMY    . TUBAL LIGATION    . WRIST SURGERY Right     Vitals:   10/17/17 1332 10/17/17 1339  BP: 126/80 136/82  Pulse: 78 88    Subjective Assessment - 10/17/17 1332    Subjective  Jodi Cardenas reporting no issues with OTAGO fall prevent program currently.  Reports visited ED two days ago due to high blood pressure and released following this without restrictions per report.      Patient is accompained by:  Family member husband    Patient Stated Goals  "To be in less pain."    Currently in Pain?  Yes    Pain Score  7     Pain Location  Back    Pain Orientation  Right;Lower    Pain Descriptors / Indicators  Sharp    Pain Type  Chronic pain    Pain Onset  More than a month ago    Pain Frequency  Intermittent    Aggravating Factors   bending,     Pain Relieving Factors  Tylenol, Salon Pas     Multiple Pain Sites  No         [facility] PT Assessment - 10/17/17 1335      Berg Balance Test   Sit to Stand  Able  to stand  independently using hands    Standing Unsupported  Able to stand 2 minutes with supervision    Sitting with Back Unsupported but Feet Supported on Floor or Stool  Able to sit 2 minutes under supervision    Stand to Sit  Uses backs of legs against chair to control descent    Transfers  Able to transfer safely, definite need of hands    Standing Unsupported with Eyes Closed  Able to stand 10 seconds with supervision    Standing Ubsupported with Feet Together  Able to place feet together independently and stand for 1 minute with supervision    From Standing, Reach Forward with Outstretched Arm  Can reach forward >5 cm safely (2")    From Standing Position, Pick up Object from Floor  Able to pick up shoe, needs supervision    From Standing Position, Turn to Look Behind Over each Shoulder  Turn sideways only but maintains balance    Turn 360 Degrees  Needs close supervision or verbal cueing    Standing Unsupported, Alternately Place Feet on Step/Stool  Able to stand independently and complete 8 steps >20 seconds    Standing Unsupported, One Foot in [place] to plae foot ahead of the other independently and hold 30 seconds    Standing on One Leg  Able to lift leg independently and hold equal to or more than 3 seconds    Total Score  36                  [facility] Adult PT Treatment/Exercise - 10/17/17 1834      Self-Care   Self-Care  Other Self-Care Comments    Other Self-Care Comments   Reviewed OTAGO handout with pt. to check for full understanding of supported level activities; pt. reporting no questions with current OTAGO program; Reviewed fall prevention strategies with pt. for home with pt. verbalizing understanding and issued handout              PT Education - 10/17/17 1836    Education provided  Yes    Education Details  Pt. issued handout for fall prevention strategies and verbalized understanding    Person(s) Educated  Patient    Methods  Explanation;Verbal  cues;Handout    Comprehension  Verbalized understanding;Verbal cues required;Need further instruction       PT Short Term Goals - 10/17/17 1834      PT SHORT TERM GOAL #1   Title  Independent with [place] fall prevention program at supported level    Status  Achieved      PT SHORT TERM GOAL #2   Title  Pt will verbalize understanding of fall prevention strategies around the home to decrease risk for falls    Status  Achieved      PT SHORT TERM GOAL #3   Title  Improve Berg to 38/56 to reduce risk for falls    Status  On-going 36/56        PT Long Term Goals - 09/26/17 1411      PT LONG TERM GOAL #1   Title  Independent with ongoing HEP/balance program as indicated    Status  On-going      PT LONG TERM GOAL #2   Title  Increase B LE strength to grossly 4/5 for improved stability    Status  On-going      PT LONG TERM GOAL #3   Title  Improve Berg score to >/= 45/56 to reduce fall risk    Status  On-going            Plan - 10/17/17 1838    Clinical Impression Statement  Jodi Cardenas reporting she visited ED due to excessively high blood pressure on 2.23.19.  Was not given restrictions following this per pt. report.  Has not been performing OTAGO HEP much recently due to feeling tired.  Vitals taken to start treatment and within therapeutic levels and pt. reporting she wishes to continue with therapy today.  Vitals monitored during treatment and responding normally to exercise in treatment.  Pt. able to demo improve score on BERG Balance test to 36/56 today.  Did discuss fall prevention strategies with pt. today and reviewed OTAGO balance HEP at supported level with pt. verbalizing understanding of current program.  Pt. requiring increased time in treatment with activities today due to requesting multiple water breaks and requiring assistance using restroom.  Will continue to progress toward goals in coming visits.  PT Treatment/Interventions  Patient/family education;ADLs/Self  Care Home Management;Neuromuscular re-education;Balance training;Therapeutic exercise;Therapeutic activities;Functional mobility training;Gait training;Moist Heat;Electrical Stimulation;Manual techniques    Consulted and Agree with Plan of Care  Patient;Family member/caregiver       Patient will benefit from skilled therapeutic intervention in order to improve the following deficits and impairments:  Decreased balance, Difficulty walking, Abnormal gait, Decreased safety awareness, Decreased mobility, Postural dysfunction, Improper body mechanics, Pain, Decreased strength, Dizziness, Decreased activity tolerance  Visit Diagnosis: Other abnormalities of gait and mobility  Unsteadiness on feet  History of falling  Muscle weakness (generalized)     Problem List Patient Active Problem List   Diagnosis Date Noted  . Autonomic dysfunction 09/19/2017  . Chronic thoracic spine pain 09/19/2017  . Disorder of the autonomic nervous system, unspecified 07/28/2017  . Gait abnormality 06/30/2017  . Chronic back pain 04/04/2017  . Osteopenia 04/04/2017  . Hyperlipidemia 02/25/2017  . Orthostatic hypotension 02/25/2017    Bess Harvest, PTA 10/17/17 6:48 PM  [facility] [address]  [place], [place], [address] Phone: [phone]   Fax:  [phone]  Name: Jodi Cardenas MRN: [id number] Date of Birth: [birth year]

## 2017-10-18 ENCOUNTER — Telehealth: Payer: Self-pay | Admitting: *Deleted

## 2017-10-18 NOTE — Telephone Encounter (Signed)
Left message for pt to call the office.

## 2017-10-19 NOTE — Telephone Encounter (Signed)
Pt Imaging CD and reports mailed to Dr Sharolyn Douglas [address] [place] [address]

## 2017-10-20 ENCOUNTER — Ambulatory Visit: Payer: Medicare Other | Admitting: Physical Therapy

## 2017-10-20 ENCOUNTER — Encounter: Payer: Self-pay | Admitting: Physical Therapy

## 2017-10-20 ENCOUNTER — Telehealth: Payer: Self-pay | Admitting: Neurology

## 2017-10-20 VITALS — BP 178/98 | HR 74

## 2017-10-20 DIAGNOSIS — M6281 Muscle weakness (generalized): Secondary | ICD-10-CM | POA: Diagnosis not present

## 2017-10-20 DIAGNOSIS — R2689 Other abnormalities of gait and mobility: Secondary | ICD-10-CM

## 2017-10-20 DIAGNOSIS — Z9181 History of falling: Secondary | ICD-10-CM

## 2017-10-20 DIAGNOSIS — R2681 Unsteadiness on feet: Secondary | ICD-10-CM | POA: Diagnosis not present

## 2017-10-20 NOTE — Therapy (Addendum)
[facility] [address]  [place], [place], [address] Phone: [phone]   Fax:  [phone]  Physical Therapy Treatment  Patient Details  Name: Jodi Cardenas MRN: [id number] Date of Birth: 05-23-1946 Referring Provider: Marcial Pacas, MD   Encounter Date: 10/20/2017  PT End of Session - 10/20/17 1318    Visit Number  5    Number of Visits  16    Date for PT Re-Evaluation  11/18/17    Authorization Type  Medicare + [employer] suppliment    PT Start Time  1318    PT Stop Time  1403    PT Time Calculation (min)  45 min    Activity Tolerance  Patient tolerated treatment well    Behavior During Therapy  [facility] for tasks assessed/performed       Past Medical History:  Diagnosis Date  . Dizziness   . Hypercholesteremia   . Orthostatic hypotension   . Osteopenia   . Scoliosis     Past Surgical History:  Procedure Laterality Date  . EYE SURGERY     lens Implants x 2  . RETINAL DETACHMENT SURGERY    . TONSILLECTOMY    . TUBAL LIGATION    . WRIST SURGERY Right     Vitals:   10/20/17 1320 10/20/17 1325 10/20/17 1353 10/20/17 1401  BP: (!) 144/84 (!) 152/90 (!) 172/96 (!) 178/98  Pulse: 82  74   SpO2: 96%  98%     Subjective Assessment - 10/20/17 1325    Subjective  Pt reporting BP remains high with SBP in 180's earlier today.    Patient Stated Goals  "To be in less pain."    Currently in Pain?  Yes    Pain Score  2     Pain Location  Back    Pain Orientation  Lower    Pain Descriptors / Indicators  Dull    Pain Type  Chronic pain    Pain Frequency  Intermittent                      [facility] Adult PT Treatment/Exercise - 10/20/17 1318      Self-Care   Self-Care  Other Self-Care Comments    Other Self-Care Comments   Educated pt on rollator safety with transfers and gait, emphasizing proper positioning of rollator as well as use of brakes.      Exercises   Exercises  Knee/Hip      Knee/Hip  Exercises: Aerobic   Nustep  L3 x 5'      Knee/Hip Exercises: Seated   Long Arc Quad  Both;10 reps;Weights;Strengthening    Long Arc Quad Weight  1 lbs.    Long [employer] Limitations  + hip ADD ball squeeze; cues for slow pace with pause at top of motion    Clamshell with TheraBand  Red alt hip ADB/ER with red TB x10 - dec control/inc fatigue R    Other Seated Knee/Hip Exercises  heel-toe raises x20    Marching  Both;10 reps;Weights;Strengthening    Marching Weights  1 lbs.    Hamstring Curl  Right;Left;10 reps;Strengthening    Hamstring Limitations  yellow TB (resistance decreased d/t poor control with red TB)               PT Short Term Goals - 10/17/17 1834      PT SHORT TERM GOAL #1   Title  Independent with [place] fall  prevention program at supported level    Status  Achieved      PT SHORT TERM GOAL #2   Title  Pt will verbalize understanding of fall prevention strategies around the home to decrease risk for falls    Status  Achieved      PT SHORT TERM GOAL #3   Title  Improve Berg to 38/56 to reduce risk for falls    Status  On-going 36/56        PT Long Term Goals - 09/26/17 1411      PT LONG TERM GOAL #1   Title  Independent with ongoing HEP/balance program as indicated    Status  On-going      PT LONG TERM GOAL #2   Title  Increase B LE strength to grossly 4/5 for improved stability    Status  On-going      PT LONG TERM GOAL #3   Title  Improve Berg score to >/= 45/56 to reduce fall risk    Status  On-going            Plan - 10/20/17 1332    Clinical Impression Statement  Jodi Cardenas reporting BP has remained elevated over the past few weeks, and has not followed up with MD since ER visit on 10/15/17. BP moderately elevated on arrival to PT, but gradually increasing throughout PT despite only light exercises and frequent rest breaks, therefore further therapy deferred today. Recommended pt contact her MD who manages her BP to f/u regarding persistently  elevated BP. Rollator used for mobility/ambulation during therapy session due to pt report of increased weakness/fatigue and associated instability, however pt requiring education on safe use of rollator esp with positioning, use of brake and proper hand placement during transfers to reduce fall risk.    Rehab Potential  Good    PT Treatment/Interventions  Patient/family education;ADLs/Self Care Home Management;Neuromuscular re-education;Balance training;Therapeutic exercise;Therapeutic activities;Functional mobility training;Gait training;Moist Heat;Electrical Stimulation;Manual techniques    Consulted and Agree with Plan of Care  Patient       Patient will benefit from skilled therapeutic intervention in order to improve the following deficits and impairments:  Decreased balance, Difficulty walking, Abnormal gait, Decreased safety awareness, Decreased mobility, Postural dysfunction, Improper body mechanics, Pain, Decreased strength, Dizziness, Decreased activity tolerance  Visit Diagnosis: Other abnormalities of gait and mobility  Unsteadiness on feet  History of falling  Muscle weakness (generalized)     Problem List Patient Active Problem List   Diagnosis Date Noted  . Autonomic dysfunction 09/19/2017  . Chronic thoracic spine pain 09/19/2017  . Disorder of the autonomic nervous system, unspecified 07/28/2017  . Gait abnormality 06/30/2017  . Chronic back pain 04/04/2017  . Osteopenia 04/04/2017  . Hyperlipidemia 02/25/2017  . Orthostatic hypotension 02/25/2017    Percival Spanish, PT, MPT 10/20/2017, 2:30 PM  [facility] [address]  [place], [place], [address] Phone: [phone]   Fax:  [phone]  Name: Jodi Cardenas MRN: [id number] Date of Birth: 1945-09-18   PHYSICAL THERAPY DISCHARGE SUMMARY  Visits from Start of Care: 5  Current functional level related to goals / functional outcomes:    Unable to formally assess status at discharge as pt was unable to return due to a decline in medical status.   Remaining deficits:   As above.   Education / Equipment:    [employer] Plan: Patient agrees to discharge.  Patient goals were partially met. Patient is  being discharged due to a change in medical status.  ?????     Percival Spanish, PT, MPT 12/13/17, 9:12 AM  [facility] [address]  [place], [place], [address] Phone: [phone]   Fax:  [phone]

## 2017-10-20 NOTE — Telephone Encounter (Signed)
Dr. Krista Blue is aware the information below and it has been noted.

## 2017-10-20 NOTE — Progress Notes (Signed)
I have reviewed and agreed above plan. 

## 2017-10-20 NOTE — Telephone Encounter (Addendum)
Pt husbands called stating the pts BP has been elevated while sitting/standing around the 200's. Stating they were going to reach out to PCP just an [place]

## 2017-10-21 DIAGNOSIS — G20 Parkinson's disease: Secondary | ICD-10-CM | POA: Diagnosis not present

## 2017-10-21 DIAGNOSIS — R5381 Other malaise: Secondary | ICD-10-CM | POA: Diagnosis not present

## 2017-10-21 DIAGNOSIS — I951 Orthostatic hypotension: Secondary | ICD-10-CM | POA: Diagnosis not present

## 2017-10-24 ENCOUNTER — Ambulatory Visit: Payer: Medicare Other

## 2017-10-27 ENCOUNTER — Ambulatory Visit: Payer: Medicare Other | Admitting: Physical Therapy

## 2017-10-27 DIAGNOSIS — C441121 Basal cell carcinoma of skin of right upper eyelid, including canthus: Secondary | ICD-10-CM | POA: Diagnosis not present

## 2017-10-31 ENCOUNTER — Emergency Department (HOSPITAL_BASED_OUTPATIENT_CLINIC_OR_DEPARTMENT_OTHER)
Admission: EM | Admit: 2017-10-31 | Discharge: 2017-10-31 | Disposition: A | Discharge status: Home / Self Care | Payer: Medicare Other | Attending: Emergency Medicine | Admitting: Emergency Medicine

## 2017-10-31 ENCOUNTER — Encounter (HOSPITAL_BASED_OUTPATIENT_CLINIC_OR_DEPARTMENT_OTHER): Payer: Self-pay | Admitting: *Deleted

## 2017-10-31 ENCOUNTER — Other Ambulatory Visit: Payer: Self-pay

## 2017-10-31 DIAGNOSIS — M549 Dorsalgia, unspecified: Secondary | ICD-10-CM | POA: Insufficient documentation

## 2017-10-31 DIAGNOSIS — Z5321 Procedure and treatment not carried out due to patient leaving prior to being seen by health care provider: Secondary | ICD-10-CM | POA: Insufficient documentation

## 2017-10-31 NOTE — ED Triage Notes (Signed)
Back pain. Hx of scoliosis. Anxious.

## 2017-11-04 ENCOUNTER — Telehealth: Payer: Self-pay | Admitting: Neurology

## 2017-11-04 MED ORDER — SERTRALINE HCL 25 MG PO TABS: 25.0000 mg | ORAL_TABLET | Freq: Every day | ORAL | 1 refills | Status: DC

## 2017-11-04 NOTE — Telephone Encounter (Signed)
Per vo by Dr. Krista Blue, she will start sertraline 25mg , one tablet daily.  If this is not helpful, they were instructed to call back.  Dr. Krista Blue stated she may increase it to 50mg  daily.  Spoke to patient and husband who were both in agreement to this plan.

## 2017-11-04 NOTE — Telephone Encounter (Signed)
Pts husband called stating that carbidopa-levodopa (SINEMET IR) 25-100 MG tablet has been working well but is causing a lot of anxiety for the pt, Jodi Cardenas wanting to know if Dr. Krista Blue could prescribe an anti anxiety medication as well. Please call to discuss

## 2017-11-08 DIAGNOSIS — Z86010 Personal history of colonic polyps: Secondary | ICD-10-CM | POA: Diagnosis not present

## 2017-11-08 DIAGNOSIS — K5901 Slow transit constipation: Secondary | ICD-10-CM | POA: Diagnosis not present

## 2017-11-18 ENCOUNTER — Telehealth: Payer: Self-pay | Admitting: Neurology

## 2017-11-18 DIAGNOSIS — G909 Disorder of the autonomic nervous system, unspecified: Secondary | ICD-10-CM

## 2017-11-18 DIAGNOSIS — R269 Unspecified abnormalities of gait and mobility: Secondary | ICD-10-CM

## 2017-11-18 DIAGNOSIS — I951 Orthostatic hypotension: Secondary | ICD-10-CM

## 2017-11-18 DIAGNOSIS — R531 Weakness: Secondary | ICD-10-CM

## 2017-11-18 NOTE — Telephone Encounter (Signed)
Pt;s husabnd called they are needing homehealth to come to for PT for her. The pt to him when she started taking carbidopa-levodopa (SINEMET IR) 25-100 MG tablet she would dose off for a nap but it seems to not be working like that now. He said the zoloft had the same effect, he could increase the zoloft. He said she is staying in bed all the time, he said the muscle in the right arm is much weaker. Please call to advise. He is aware the clinic closes at noon today

## 2017-11-18 NOTE — Telephone Encounter (Signed)
Spoke with husband and explained PT orders can be sent to [facility] gait/balance training, strength conditioning, but generally home health agencies must have recent ov notes (w/i 30 days I believe) in order to be able to start care. We can send orders in; if sooner appt. is needed in order for home health to go to their home, we will do our best to get her in. We discussed that drowsiness can be a side effect of Sinemet and Zoloft, but that these side effects generally lessen the longer she takes the medication, and this is normal.  Sinemet and Zoloft were not given for sleep, so they won't be increased to treat sleep.  Husband sts. she was on Ambien years ago; pt. would like to restart this, but husband has concerns about restarting it.  I have explained that Ambien has side effects that can increase her fall risk--which is already high due to orthostatic hypotension, gait disturbance, weakness.  Dr. Krista Blue can discuss this with them at pt's pending appt.  PTorders sent to [facility].  Husband is very pleasant and agreeable with this plan/fim

## 2017-11-21 ENCOUNTER — Telehealth: Payer: Self-pay | Admitting: Neurology

## 2017-11-21 MED ORDER — CARBIDOPA-LEVODOPA 25-100 MG PO TABS: ORAL_TABLET | ORAL | 0 refills | Status: DC

## 2017-11-21 NOTE — Telephone Encounter (Signed)
Patient's husband is calling regarding increasing dosage of  carbidopa-levodopa (SINEMET IR) 25-100 MG tablet. The patient is experiencing shortness of breath and shakiness.Please call and discuss.

## 2017-11-21 NOTE — Addendum Note (Signed)
Addended by: Noberto Retort C on: 11/21/2017 02:58 PM   Modules accepted: Orders

## 2017-11-21 NOTE — Telephone Encounter (Signed)
Per vo by Dr. Krista Blue, patient may increase Sinemet up to 2 tablets TID (new rx sent to pharmacy).  Her husband agrees with this decision and states he has already tried giving her increased doses which have been helpful for the symptoms.   He was asking about her PT referral to [facility].  This was just placed on 11/18/17.  He is aware they may not have had to chance to reach out yet but we will follow up on the referral for them.  He was very [employer] attorney.

## 2017-11-21 NOTE — Telephone Encounter (Signed)
Referral was sent to [facility]

## 2017-11-24 DIAGNOSIS — G239 Degenerative disease of basal ganglia, unspecified: Secondary | ICD-10-CM | POA: Diagnosis not present

## 2017-11-24 DIAGNOSIS — I951 Orthostatic hypotension: Secondary | ICD-10-CM | POA: Diagnosis not present

## 2017-11-29 ENCOUNTER — Telehealth: Payer: Self-pay | Admitting: Neurology

## 2017-11-29 DIAGNOSIS — I951 Orthostatic hypotension: Secondary | ICD-10-CM | POA: Diagnosis not present

## 2017-11-29 DIAGNOSIS — G239 Degenerative disease of basal ganglia, unspecified: Secondary | ICD-10-CM | POA: Diagnosis not present

## 2017-11-29 NOTE — Telephone Encounter (Signed)
Per vo by Dr. Krista Blue, okay to provide verbal orders for occupational and speech therapy.  I returned call to [facility] with Alvis Lemmings and he has been provided with the orders.

## 2017-11-29 NOTE — Telephone Encounter (Signed)
PT Jodi Cardenas with [facility] has called asking for verbal orders for speech therapist(because pt is having difficulty swallowing ) and occupational therapist.  Barth Kirks can be reached at [phone]

## 2017-11-30 DIAGNOSIS — I951 Orthostatic hypotension: Secondary | ICD-10-CM | POA: Diagnosis not present

## 2017-11-30 DIAGNOSIS — G239 Degenerative disease of basal ganglia, unspecified: Secondary | ICD-10-CM | POA: Diagnosis not present

## 2017-11-30 NOTE — Telephone Encounter (Signed)
Returned call to [employer] and provided verbal orders for OT.

## 2017-11-30 NOTE — Telephone Encounter (Signed)
Caprice Red occupational therapist requesting verbal orders for OT once a week for 4 weeks focusing on ADL and exercising. Timmothy Sours can be reached at [phone]

## 2017-12-01 ENCOUNTER — Telehealth: Payer: Self-pay | Admitting: Neurology

## 2017-12-01 DIAGNOSIS — G239 Degenerative disease of basal ganglia, unspecified: Secondary | ICD-10-CM | POA: Diagnosis not present

## 2017-12-01 DIAGNOSIS — I951 Orthostatic hypotension: Secondary | ICD-10-CM | POA: Diagnosis not present

## 2017-12-01 NOTE — Telephone Encounter (Addendum)
Dr. Krista Blue has recommended frequent rest periods.  I have spoke with Hassan Rowan and she is also concerned about the new symptoms of tingling in her feet.  Additionally, she would like to further discuss her appt at [facility] with Dr. Krista Blue.  She would like an earlier appt.  It has been moved from 12/27/17 to 12/08/17.

## 2017-12-01 NOTE — Telephone Encounter (Signed)
Returned call to [place] - the patient and husband have reported that she is experiencing shortness of breath when eating (normal) and when she is laying down (new).  The PT stated her vital signs were normal, including her O2 Sat being 98%.  The PT did feel she was mildly more labored today with activity and did not want to walk much past 40 feet.  When [place] suggested she may need to be further evaluated, her husband said he felt this issue may be more anxiety related.

## 2017-12-01 NOTE — Telephone Encounter (Signed)
Jodi Cardenas a PT with [facility] requesting a call back stating the pt is having shortness of breathe. Started yesterday evening husband states it even occurs while laying down, all vitals are normal. Jodi Cardenas can be reached at [phone]

## 2017-12-06 DIAGNOSIS — G239 Degenerative disease of basal ganglia, unspecified: Secondary | ICD-10-CM | POA: Diagnosis not present

## 2017-12-06 DIAGNOSIS — I951 Orthostatic hypotension: Secondary | ICD-10-CM | POA: Diagnosis not present

## 2017-12-08 ENCOUNTER — Encounter: Payer: Self-pay | Admitting: Neurology

## 2017-12-08 ENCOUNTER — Ambulatory Visit (INDEPENDENT_AMBULATORY_CARE_PROVIDER_SITE_OTHER): Payer: Medicare Other | Admitting: Neurology

## 2017-12-08 ENCOUNTER — Telehealth: Payer: Self-pay | Admitting: Neurology

## 2017-12-08 VITALS — BP 120/88 | HR 68 | Resp 20 | Ht 62.5 in

## 2017-12-08 DIAGNOSIS — G239 Degenerative disease of basal ganglia, unspecified: Secondary | ICD-10-CM | POA: Diagnosis not present

## 2017-12-08 DIAGNOSIS — G238 Other specified degenerative diseases of basal ganglia: Secondary | ICD-10-CM | POA: Insufficient documentation

## 2017-12-08 DIAGNOSIS — R269 Unspecified abnormalities of gait and mobility: Secondary | ICD-10-CM | POA: Diagnosis not present

## 2017-12-08 DIAGNOSIS — G903 Multi-system degeneration of the autonomic nervous system: Secondary | ICD-10-CM

## 2017-12-08 DIAGNOSIS — I951 Orthostatic hypotension: Secondary | ICD-10-CM

## 2017-12-08 MED ORDER — ALPRAZOLAM 0.25 MG PO TABS: 0.2500 mg | ORAL_TABLET | Freq: Three times a day (TID) | ORAL | 5 refills | Status: DC | PRN

## 2017-12-08 NOTE — Telephone Encounter (Signed)
error 

## 2017-12-08 NOTE — Telephone Encounter (Signed)
Dr. Krista Blue is aware.

## 2017-12-08 NOTE — Progress Notes (Signed)
PATIENT: Jodi Cardenas DOB: 1946/07/16  Chief Complaint  Patient presents with  . Dizziness    In Dr. Edwena Felty room, # 11. Has not been taking the Mestinon b/c BP has been low. Feeling anxious today./fim     HISTORICAL  Jodi Cardenas is a 72 years old right-handed female, seen in refer by her primary care doctor  Leighton Ruff, for evaluation of dizziness, initial evaluation was April 04 2017.  I reviewed and summarized the referral note, she had a history of hyperlipidemia, osteopenia,chronic  insomnia, take Ambien75m prn. History of right eye retinal detachment.  I was able to review her ER presentation on Jan 14 2017, for evaluation of generalized fatigue and dizziness, not feeling well, recurrent lightheadedness and dizziness, there was documented orthostatic blood pressure changes, there was also evidence of UTI, her symptoms has much improved with IV steroid.  She had a history of kyphosis, scoliosis,progressive worsening neck to lower back pain, limited mobility, since May 2018, she began to notice dizziness, she only notices dizziness when she is up about, standing for few minutes, she felt lightheadedness, whoozing sensation, no vertigo, she denies similar dizziness in the sitting down or lying down position  Laboratory evaluations in July 2018, normal CMP, creatinine 0.77, normal TSH 1.90 CBC, hemoglobin of 13.6, triglyceride 96, triglycerides right148, LDL 61.  UPDATE April 22 2017: Laboratory evaluation showed normal ESR C-reactive protein, protein electrophoresis, ANA, copper, vitamin B1, D, CPK, thyroid functional tasks, B12, A1c was 5.4.  Today's electrodiagnostic studies normal, there is no evidence of peripheral neuropathy, she tried Cymbalta 60 mg daily, did not help her symptoms, she continue complaining significant multiple joints pain, shoulder pain, chronic low back pain, dizziness when getting up quickly, husband reported a positive orthostatic blood  pressure changes.   Update July 01 2017: She is accompanied by her son at today's clinical visit, she has gradually declining functional status, increased low back pain, shoulder pain, unsteady gait, lightheadedness when get up quickly. Midodrine provide limited help  UPDATE Jul 20 2017: Accompanied by her husband and the son at today's clinical visit, just, especially from lying down to standing up, today lying down blood pressure 140/80, heart rate of 76, standing up 90/61 heart rate of 92, she does complains of orthostatic dizziness,  She also complains of worsening bilateral shoulder pain, intermittent bilateral hand tremor, right arm, leg weakness, there was mild parkinsonian features on today's examination,  I have personally reviewed MRI cervical multiple level degenerative changes, T1-T2, there is a right paramedian disc herniation indent the thecal sac, distort the spinal cord, there was no cord signal changes  UPDATE Sep 19 2017: She fell early December 2018 with multiple right-sided rib fracture, she also complains of worsening neck, low back pain, deconditioning,  Today she had a severe orthostatic hypotension, lying down blood pressure 126/62, heart rate of 74, 69/44, heart rate of 92, sitting down 100/70  She is taking 60 mg 3 times a day, was noted to have mild parkinsonian features, right worse than left, his right side arm swing, but the most debilitating symptoms are her significant orthostatic hypotension.  We have personally reviewed MRI of thoracic spine, exaggerated scoliosis, no spinal stenosis, MRI of the brain showed no significant abnormality.  MRI of cervical spine showed multilevel degenerative changes, but there is no evidence of nerve root compression  UPDATE December 08 2017:  I was able to review her [facility] visit on October 13, 2017 by Dr. SSharolyn Douglas She  complains of gradually worsening trouble  using computer mouse, right hand is very weak, increased  imbalance, several falls  Noted severe hypertension up to systolic 831D,  Significant orthostasis, parkinsonian is him, mild to moderate asymmetric parkinsonism worsening on the right side, bradykinesia, rigidity, gait impairment, which is the possibility of multisystem atrophy, good response to Sinemet, she was to continue Mestinon, increase salt intake, elevate head of bed, compresses follow-up,  Today she comes in with her husband and daughter, she is going to move to assisted living in [place] area, she complains of worsening anxiety, worsening appetite, constipation's  REVIEW OF SYSTEMS: Full 14 system review of systems performed and notable only for appetite change, fatigue, drooling, shortness of breath, constipation, urgency, joint pain, back pain, walking difficulty, difficulty urinating, dizziness, speech difficulty, weakness, tremor, agitation depression anxiety No Known Allergies  HOME MEDICATIONS: Current Outpatient Medications  Medication Sig Dispense Refill  . Acetaminophen (TYLENOL ARTHRITIS PAIN PO) Take 1,250 mg by mouth as needed. Extended release     . aspirin 81 MG tablet Take 81 mg by mouth daily.    . Biotin 2500 MCG CAPS Take by mouth 2 (two) times daily.    . carbidopa-levodopa (SINEMET IR) 25-100 MG tablet Take up to two tablets TID. 540 tablet 0  . Coenzyme Q-10 100 MG capsule Take 100 mg by mouth daily.    . Multiple Vitamin (MULTIVITAMIN) capsule Take 1 capsule by mouth daily.    Marland Kitchen OVER THE COUNTER MEDICATION Take 250 mg by mouth daily. Resverartol    . Probiotic Product (PROBIOTIC DAILY PO) Take by mouth.    . sertraline (ZOLOFT) 25 MG tablet Take 1 tablet (25 mg total) by mouth daily. 90 tablet 1  . Vitamin D, Ergocalciferol, 2000 units CAPS Take 2,000 Units by mouth daily.    Marland Kitchen oxycodone-acetaminophen (PERCOCET) 2.5-325 MG tablet Take 1 tablet by mouth every 8 (eight) hours as needed for pain. (Patient not taking: Reported on 12/08/2017) 90 tablet 0  .  pyridostigmine (MESTINON) 60 MG tablet Take 1 tablet by mouth 3 (three) times daily.     No current facility-administered medications for this visit.     PAST MEDICAL HISTORY: Past Medical History:  Diagnosis Date  . Dizziness   . Hypercholesteremia   . Orthostatic hypotension   . Osteopenia   . Scoliosis     PAST SURGICAL HISTORY: Past Surgical History:  Procedure Laterality Date  . EYE SURGERY     lens Implants x 2  . RETINAL DETACHMENT SURGERY    . TONSILLECTOMY    . TUBAL LIGATION    . WRIST SURGERY Right     FAMILY HISTORY: Family History  Problem Relation Age of Onset  . Heart disease Father     SOCIAL HISTORY:  Social History   Socioeconomic History  . Marital status: Married    Spouse name: Not on file  . Number of children: 3  . Years of education: College  . Highest education level: Not on file  Occupational History  . Occupation: Retired Animal nutritionist  . Financial resource strain: Not on file  . Food insecurity:    Worry: Not on file    Inability: Not on file  . Transportation needs:    Medical: Not on file    Non-medical: Not on file  Tobacco Use  . Smoking status: Never Smoker  . Smokeless tobacco: Never Used  Substance and Sexual Activity  . Alcohol use: Yes    Comment: occasional  wine  . Drug use: No  . Sexual activity: Not on file  Lifestyle  . Physical activity:    Days per week: Not on file    Minutes per session: Not on file  . Stress: Not on file  Relationships  . Social connections:    Talks on phone: Not on file    Gets together: Not on file    Attends religious service: Not on file    Active member of club or organization: Not on file    Attends meetings of clubs or organizations: Not on file    Relationship status: Not on file  . Intimate partner violence:    Fear of current or ex partner: Not on file    Emotionally abused: Not on file    Physically abused: Not on file    Forced sexual activity: Not on file    Other Topics Concern  . Not on file  Social History Narrative   Lives at home with husband.   Right-handed.   Two cups coffee per day.     PHYSICAL EXAM   Vitals:   12/08/17 1031  BP: 120/88  Pulse: 68  Resp: 20  Height: 5' 2.5" (1.588 m)    Not recorded    Blood pressure lying down 150/74, heart rate of 68, sitting up 113/62 heart rate of 76, standing, 90/60  Body mass index is 19.26 kg/m.  PHYSICAL EXAMNIATION:  Gen: NAD, conversant, well nourised, obese, well groomed                     Cardiovascular: Regular rate rhythm, no peripheral edema, warm, nontender. Eyes: Conjunctivae clear without exudates or hemorrhage Neck: Supple, no carotid bruits. Pulmonary: Clear to auscultation bilaterally   NEUROLOGICAL EXAM:  MENTAL STATUS: Speech:    Speech is normal; fluent and spontaneous with normal comprehension.  Cognition:     Orientation to time, place and person     Normal recent and remote memory     Normal Attention span and concentration     Normal Language, naming, repeating,spontaneous speech     Fund of knowledge   CRANIAL NERVES: CN II: Visual fields are full to confrontation. Fundoscopic exam is normal with sharp discs and no vascular changes. Pupils are round equal and briskly reactive to light. CN III, IV, VI: extraocular movement are normal. No ptosis. CN V: Facial sensation is intact to pinprick in all 3 divisions bilaterally. Corneal responses are intact.  CN VII: Face is symmetric with normal eye closure and smile. CN VIII: Hearing is normal to rubbing fingers CN IX, X: Palate elevates symmetrically. Phonation is normal. CN XI: Head turning and shoulder shrug are intact CN XII: Tongue is midline with normal movements and no atrophy.  MOTOR: She has mild fixation of right upper extremity upon rapid rotating movement, rigidity, bradykinesia in all 4 extremities, increased with reinforcement maneuver, right worse than left,  REFLEXES: Reflexes  are 2+ and symmetric at the biceps, triceps, knees, and ankles. Plantar responses are flexor.  SENSORY: length dependent decreased to light touch, pinprick and the vibratory sensation at toes, distal leg  COORDINATION: Rapid alternating movements and fine finger movements are intact. There is no dysmetria on finger-to-nose and heel-knee-shin.    GAIT/STANCE: She needs push up to get up from seated position, mildly unsteady, kyphosis, small stride, decreased right arm swing   DIAGNOSTIC DATA (LABS, IMAGING, TESTING) - I reviewed patient records, labs, notes, testing and imaging myself where available.  ASSESSMENT AND PLAN  Jodi Cardenas is a 72 y.o. female   Multisystem atrophy  With mild parkinsonian features, significant orthostatic autonomic failure,  There is mild parkinsonian features on evaluation, more on the right side,  Extensive laboratory evaluation failed to demonstrate etiology  Mestinon 60  mg 3 times a day was somewhat helpful  She is taking Sinemet 25/100 2 tablets 3 times a day  The most disabilitating symptoms is her orthostatic hypotension,  Will move to [place] area assisted-living   Marcial Pacas, M.D. Ph.D.  [facility] [address], [place], [place] [address] Ph: [phone] Fax: [phone]  CC: Leighton Ruff, MD

## 2017-12-08 NOTE — Telephone Encounter (Signed)
[facility] occupational therapist called to inform us the pts caregiver declined OT for the remainder of the week due to pt being to week but requesting to returning next week. Any questions Timmothy Sours can be reached at [phone]

## 2017-12-08 NOTE — Progress Notes (Signed)
120/88 

## 2017-12-12 DIAGNOSIS — G239 Degenerative disease of basal ganglia, unspecified: Secondary | ICD-10-CM | POA: Diagnosis not present

## 2017-12-12 DIAGNOSIS — I951 Orthostatic hypotension: Secondary | ICD-10-CM | POA: Diagnosis not present

## 2017-12-15 ENCOUNTER — Telehealth: Payer: Self-pay | Admitting: Neurology

## 2017-12-15 DIAGNOSIS — I951 Orthostatic hypotension: Secondary | ICD-10-CM | POA: Diagnosis not present

## 2017-12-15 DIAGNOSIS — R0602 Shortness of breath: Secondary | ICD-10-CM

## 2017-12-15 DIAGNOSIS — G239 Degenerative disease of basal ganglia, unspecified: Secondary | ICD-10-CM | POA: Diagnosis not present

## 2017-12-15 NOTE — Telephone Encounter (Signed)
I called the patient to talk with her husband.  The patient has multisystems atrophy, the patient has had problems with shortness of breath.  The patient apparently has scoliosis that may impair her ability to breathe.  She is short winded more when she is upright.  The husband indicates that sometimes the blood pressure is low when she is short winded and sometimes it does not.  Certainly, when the patient is severely orthostatic, dyspnea may be part of the symptom complex.  The patient has been on Florinef and midodrine previously, she currently is on Mestinon 60 mg 3 times daily.  I have recommended adding back 2.5 mg of midodrine in the morning.  If all of the shortness of breath episodes are not associated with orthostatic hypotension, I would recommend a pulmonary evaluation.  I have also indicated that at times her oxygen saturations will go down in the 70s.  I will make a pulmonary referral.

## 2017-12-15 NOTE — Telephone Encounter (Signed)
Pts husband requesting a call stating the pt has been having a terrible time breathing and would like to discuss the pts options to help. Pts husband states it hard for her to sit up, move and having trouble eating due to lack of air.

## 2017-12-19 NOTE — Telephone Encounter (Signed)
Jodi Cardenas with [facility] stated patient discharged from OT on 12-16-17 due to caregiver declining OT services. A returned call is not needed unless there are questions.

## 2017-12-21 ENCOUNTER — Telehealth: Payer: Self-pay | Admitting: Neurology

## 2017-12-21 DIAGNOSIS — I951 Orthostatic hypotension: Secondary | ICD-10-CM | POA: Diagnosis not present

## 2017-12-21 DIAGNOSIS — G239 Degenerative disease of basal ganglia, unspecified: Secondary | ICD-10-CM | POA: Diagnosis not present

## 2017-12-21 NOTE — Telephone Encounter (Signed)
Patient';s husband is calling. He is very concerned because the patient is losing weight, probably 1 pound a day. Is there something she can take to increase her appetite. Please call and discuss.

## 2017-12-22 ENCOUNTER — Ambulatory Visit: Payer: Medicare Other | Admitting: Neurology

## 2017-12-22 DIAGNOSIS — I951 Orthostatic hypotension: Secondary | ICD-10-CM | POA: Diagnosis not present

## 2017-12-22 DIAGNOSIS — G239 Degenerative disease of basal ganglia, unspecified: Secondary | ICD-10-CM | POA: Diagnosis not present

## 2017-12-22 NOTE — Telephone Encounter (Addendum)
Spoke to Mr. Leblond - he is concerned about his wife's weight loss.  He has already cut her Sinemet 25-100 back to 1 tablet TID.  Also, he has consulted with her speech therapist that has recommended him to make a puree of high calorie foods.  He even plans to start adding ice cream to the puree just to get the calories up. He is keeping her well hydrated.  He is asking if there is an oral medication that may also help increase her appetite.

## 2017-12-22 NOTE — Telephone Encounter (Signed)
Spoke to her speech therapist, Jodi Cardenas, with [facility].  States Jodi Cardenas is able to swallow soft foods without getting choked but just not thriving.  She has recommended him to give her food throughout the day.  Says her lips, tongue and cheeks are weak so she has difficulty chewing.  She is nearly bed bound now and not doing well.  Says Jodi Cardenas is overwhelmed. They are still discussing selling their home and moving to an assisted living community.  If we have any other questions, Jodi Cardenas can be reached at [phone].

## 2017-12-23 NOTE — Telephone Encounter (Signed)
Consider low-dose of Remeron starting from 15 mg, half tablet every night  It is used for antidepression, but to multi-mechanism, potentially cause sedation, also increased orthostatic hypotension.

## 2017-12-23 NOTE — Telephone Encounter (Signed)
Spoke to Mr. Baxley - he would like to continue trying to get her weight up by giving her high caloric puree foods.  He does not want to consider Remeron at this time.  He would like to know if she can stop her Mestinon.  Per vo by Dr. Krista Blue, it is okay to discontinue Mestinon.  She also suggested stopping Sinemet but he does not want to do this currently. She is still using Xanax, as needed.

## 2017-12-23 NOTE — Addendum Note (Signed)
Addended by: Noberto Retort C on: 12/23/2017 11:38 AM   Modules accepted: Orders

## 2017-12-27 ENCOUNTER — Ambulatory Visit: Payer: Medicare Other | Admitting: Neurology

## 2017-12-28 DIAGNOSIS — I951 Orthostatic hypotension: Secondary | ICD-10-CM | POA: Diagnosis not present

## 2017-12-28 DIAGNOSIS — G239 Degenerative disease of basal ganglia, unspecified: Secondary | ICD-10-CM | POA: Diagnosis not present

## 2017-12-29 NOTE — Telephone Encounter (Signed)
Spoke to [place] who noticed signs and symptoms of GERD.  She is going to contact her PCP, Dr. Drema Dallas, to further discuss this finding.

## 2017-12-29 NOTE — Telephone Encounter (Signed)
Lorriane Shire speech therapist with [facility] requesting a call back to discuss new issues/ symptoms the pts having. Please contact at [phone]

## 2017-12-30 DIAGNOSIS — R5381 Other malaise: Secondary | ICD-10-CM | POA: Diagnosis not present

## 2017-12-30 DIAGNOSIS — G20 Parkinson's disease: Secondary | ICD-10-CM | POA: Diagnosis not present

## 2017-12-30 DIAGNOSIS — R627 Adult failure to thrive: Secondary | ICD-10-CM | POA: Diagnosis not present

## 2018-01-04 ENCOUNTER — Encounter: Payer: Self-pay | Admitting: Neurology

## 2018-01-06 ENCOUNTER — Other Ambulatory Visit: Payer: Self-pay | Admitting: *Deleted

## 2018-01-06 DIAGNOSIS — G239 Degenerative disease of basal ganglia, unspecified: Secondary | ICD-10-CM | POA: Diagnosis not present

## 2018-01-06 DIAGNOSIS — R531 Weakness: Secondary | ICD-10-CM

## 2018-01-06 DIAGNOSIS — I951 Orthostatic hypotension: Secondary | ICD-10-CM | POA: Diagnosis not present

## 2018-01-06 DIAGNOSIS — G909 Disorder of the autonomic nervous system, unspecified: Secondary | ICD-10-CM

## 2018-01-06 DIAGNOSIS — R269 Unspecified abnormalities of gait and mobility: Secondary | ICD-10-CM

## 2018-01-10 ENCOUNTER — Telehealth: Payer: Self-pay | Admitting: Neurology

## 2018-01-10 ENCOUNTER — Encounter: Payer: Self-pay | Admitting: Neurology

## 2018-01-10 NOTE — Telephone Encounter (Signed)
Jodi Cardenas from [facility] needs nursing order please . Thanks Jodi Cardenas

## 2018-01-11 ENCOUNTER — Telehealth: Payer: Self-pay | Admitting: Neurology

## 2018-01-11 DIAGNOSIS — G239 Degenerative disease of basal ganglia, unspecified: Secondary | ICD-10-CM | POA: Diagnosis not present

## 2018-01-11 DIAGNOSIS — I951 Orthostatic hypotension: Secondary | ICD-10-CM | POA: Diagnosis not present

## 2018-01-11 NOTE — Telephone Encounter (Signed)
Please call patient, I have no experience with nilotinib at all, Which is a chemotherapy for leukemia, I will not be able to write the prescription for her  He may contact [facility] academic neurologist for second opinion

## 2018-01-11 NOTE — Telephone Encounter (Signed)
Spoke to patient - he is aware of Dr. Rhea Belton response.  He understands that the medication is not approved by the FDA for her diagnosis.  He has been in contact with [facility] because they have just completed their phase 1 research trial.  They are getting ready to begin phase 2.  He is also going to contact [facility].  He was appreciative of Dr. Krista Blue taking the time to review the information he emailed.

## 2018-01-18 DIAGNOSIS — G239 Degenerative disease of basal ganglia, unspecified: Secondary | ICD-10-CM | POA: Diagnosis not present

## 2018-01-18 DIAGNOSIS — I951 Orthostatic hypotension: Secondary | ICD-10-CM | POA: Diagnosis not present

## 2018-01-20 ENCOUNTER — Telehealth: Payer: Self-pay | Admitting: Neurology

## 2018-01-20 NOTE — Telephone Encounter (Signed)
Jodi Cardenas with [facility], requesting VO for OT, stating the pt is having a heard time feeding herself. Also the pt has gained 9 pounds. Please contact at [phone]

## 2018-01-20 NOTE — Telephone Encounter (Signed)
Spoke to [place] - provided verbal orders for OT.  She will be working with her with the goal to be able to feed herself.  States patient is doing better and gaining weight.

## 2018-01-23 DIAGNOSIS — I951 Orthostatic hypotension: Secondary | ICD-10-CM | POA: Diagnosis not present

## 2018-01-23 DIAGNOSIS — G239 Degenerative disease of basal ganglia, unspecified: Secondary | ICD-10-CM | POA: Diagnosis not present

## 2018-01-24 DIAGNOSIS — G239 Degenerative disease of basal ganglia, unspecified: Secondary | ICD-10-CM | POA: Diagnosis not present

## 2018-01-24 DIAGNOSIS — I951 Orthostatic hypotension: Secondary | ICD-10-CM | POA: Diagnosis not present

## 2018-01-25 NOTE — Telephone Encounter (Signed)
Timmothy Sours N/ZVJKQA [phone] called to advise home exercise program in place. Pt and caregiver decline the need for feeding training or other OT services.  FYI

## 2018-01-27 ENCOUNTER — Encounter: Payer: Self-pay | Admitting: Neurology

## 2018-01-30 ENCOUNTER — Encounter: Payer: Self-pay | Admitting: *Deleted

## 2018-01-30 ENCOUNTER — Telehealth: Payer: Self-pay | Admitting: Neurology

## 2018-01-30 NOTE — Telephone Encounter (Signed)
Spoke to her husband - he is aware and understanding of Dr. Rhea Belton decision.

## 2018-01-30 NOTE — Telephone Encounter (Signed)
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

## 2018-02-01 DIAGNOSIS — I951 Orthostatic hypotension: Secondary | ICD-10-CM | POA: Diagnosis not present

## 2018-02-01 DIAGNOSIS — G239 Degenerative disease of basal ganglia, unspecified: Secondary | ICD-10-CM | POA: Diagnosis not present

## 2018-02-04 ENCOUNTER — Other Ambulatory Visit: Payer: Self-pay | Admitting: Neurology

## 2018-02-06 ENCOUNTER — Other Ambulatory Visit: Payer: Self-pay | Admitting: Neurology

## 2018-02-06 DIAGNOSIS — I951 Orthostatic hypotension: Secondary | ICD-10-CM | POA: Diagnosis not present

## 2018-02-06 DIAGNOSIS — G239 Degenerative disease of basal ganglia, unspecified: Secondary | ICD-10-CM | POA: Diagnosis not present

## 2018-02-07 ENCOUNTER — Encounter: Payer: Self-pay | Admitting: *Deleted

## 2018-02-13 ENCOUNTER — Encounter: Payer: Self-pay | Admitting: Neurology

## 2018-02-13 ENCOUNTER — Telehealth: Payer: Self-pay | Admitting: Neurology

## 2018-02-13 NOTE — Telephone Encounter (Signed)
Fail to reach patient and her family, will try to call again later.

## 2018-02-14 NOTE — Telephone Encounter (Signed)
I have talked with her husband Mr. Bless, her weight is down, has poor appetite, take milk shake,   I was able to give him some information about cannabidol, which is approved for adjunctive therapy for [facility] syndrome, given to patient based on weight, 2.5mg /kg daily titrating to 10mg /kg bid,

## 2018-02-16 DIAGNOSIS — I951 Orthostatic hypotension: Secondary | ICD-10-CM | POA: Diagnosis not present

## 2018-02-16 DIAGNOSIS — G239 Degenerative disease of basal ganglia, unspecified: Secondary | ICD-10-CM | POA: Diagnosis not present

## 2018-02-22 DIAGNOSIS — G239 Degenerative disease of basal ganglia, unspecified: Secondary | ICD-10-CM | POA: Diagnosis not present

## 2018-02-22 DIAGNOSIS — I951 Orthostatic hypotension: Secondary | ICD-10-CM | POA: Diagnosis not present

## 2018-02-28 ENCOUNTER — Telehealth: Payer: Self-pay | Admitting: Neurology

## 2018-02-28 NOTE — Telephone Encounter (Signed)
Xia.Dress Speech Therapist with [facility] [phone] has called from pt's home to report a fall.   Cyd Silence stated pt informed her that on 02-23-18 she loss her balance, possibly blacked out and fell backwards.  Cyd Silence states pt informed her today that she is a little sore, ribs sore.  No request for a call back but  Cyd Silence  Can be reached at [phone]

## 2018-02-28 NOTE — Telephone Encounter (Addendum)
Spoke to patient's husband - reports she lost her balance and fell against a table.  She has pain and discomfort on her left side, around her ribcage. No visible bruising.  He has been giving her oxycodone prn that is helpful.  Her pain is not as severe as when she previously fractured several ribs.  No worsening in her breathing.  Updated Dr. Krista Blue on her condition.

## 2018-03-02 DIAGNOSIS — G239 Degenerative disease of basal ganglia, unspecified: Secondary | ICD-10-CM | POA: Diagnosis not present

## 2018-03-02 DIAGNOSIS — I951 Orthostatic hypotension: Secondary | ICD-10-CM | POA: Diagnosis not present

## 2018-03-04 ENCOUNTER — Other Ambulatory Visit: Payer: Self-pay | Admitting: Neurology

## 2018-03-06 ENCOUNTER — Encounter: Payer: Self-pay | Admitting: *Deleted

## 2018-03-06 ENCOUNTER — Encounter: Payer: Self-pay | Admitting: Neurology

## 2018-03-07 ENCOUNTER — Encounter: Payer: Self-pay | Admitting: *Deleted

## 2018-03-07 NOTE — Telephone Encounter (Signed)
Patient's husband is aware Dr. Krista Blue will address upon her return to the office.

## 2018-03-08 DIAGNOSIS — G239 Degenerative disease of basal ganglia, unspecified: Secondary | ICD-10-CM | POA: Diagnosis not present

## 2018-03-08 DIAGNOSIS — I951 Orthostatic hypotension: Secondary | ICD-10-CM | POA: Diagnosis not present

## 2018-03-14 ENCOUNTER — Telehealth: Payer: Self-pay | Admitting: Neurology

## 2018-03-14 MED ORDER — SERTRALINE HCL 25 MG PO TABS: 75.0000 mg | ORAL_TABLET | Freq: Every day | ORAL | 11 refills | Status: AC

## 2018-03-14 NOTE — Telephone Encounter (Signed)
I have called in higher dose of Zoloft 25 mg 3 tablets daily,  Also mail him the article of ganglionic acetylcholine receptor autoantibody, he will review the article, to see if he wants to proceed with [facility] paraneoplastic panel,  She also has appointment pending with [place] neurologist Dr. Tillman Sers at the end of July 2019

## 2018-03-15 DIAGNOSIS — G239 Degenerative disease of basal ganglia, unspecified: Secondary | ICD-10-CM | POA: Diagnosis not present

## 2018-03-15 DIAGNOSIS — I951 Orthostatic hypotension: Secondary | ICD-10-CM | POA: Diagnosis not present

## 2018-03-20 ENCOUNTER — Encounter: Payer: Self-pay | Admitting: Neurology

## 2018-03-21 ENCOUNTER — Telehealth: Payer: Self-pay | Admitting: *Deleted

## 2018-03-21 ENCOUNTER — Other Ambulatory Visit: Payer: Self-pay | Admitting: *Deleted

## 2018-03-21 DIAGNOSIS — G909 Disorder of the autonomic nervous system, unspecified: Secondary | ICD-10-CM

## 2018-03-21 NOTE — Telephone Encounter (Signed)
Dr. Krista Blue has ordered the following lab to be sent to the [facility]: paraneoplastic, autoantibody evaluation, serum. The patient will come to our office to have her labs drawn and [facility] will be responsible for sending the labs to the [facility] to be processed.  Per the [facility], the cost of the lab will be $1300.00.  Insurance may or may not cover the test.  The patient's husband is aware and agreeable to pay the cost required for the results.  He is aware the bill may also reflect a lab draw and handling fee. He will be billed by [facility].

## 2018-03-23 ENCOUNTER — Other Ambulatory Visit (INDEPENDENT_AMBULATORY_CARE_PROVIDER_SITE_OTHER): Payer: Self-pay

## 2018-03-23 ENCOUNTER — Other Ambulatory Visit: Payer: Self-pay | Admitting: Neurology

## 2018-03-23 DIAGNOSIS — G909 Disorder of the autonomic nervous system, unspecified: Secondary | ICD-10-CM

## 2018-03-23 DIAGNOSIS — Z0289 Encounter for other administrative examinations: Secondary | ICD-10-CM

## 2018-04-04 LAB — OTHER LAB TEST

## 2018-04-13 ENCOUNTER — Telehealth: Payer: Self-pay | Admitting: *Deleted

## 2018-04-13 ENCOUNTER — Other Ambulatory Visit: Payer: Self-pay | Admitting: *Deleted

## 2018-04-13 DIAGNOSIS — G909 Disorder of the autonomic nervous system, unspecified: Secondary | ICD-10-CM

## 2018-04-13 NOTE — Telephone Encounter (Signed)
Pts husband Jeneen Rinks called stating he would like blood work done through [facility](phone # [phone]) on [address]. Stating they are abel to come to the pts home.  Please advise

## 2018-04-13 NOTE — Telephone Encounter (Signed)
Lab:  PAVAL (Paraneoplastic, Autoantibody Evaluation, Serum)  ICD10:  G90.9 - Autonomic Failure

## 2018-04-13 NOTE — Telephone Encounter (Signed)
Left message at [facility] to make sure once the lab is drawn, they are able to send to the [facility].  Returned call to Jodi Cardenas to let him know I am waiting on a return call.

## 2018-04-13 NOTE — Telephone Encounter (Signed)
Spoke to Mr. Groeneveld - he is aware that the sample was received by [facility] in the incorrect tube.  New orders for repeat labs placed today.  He is going to take her to [facility] (at [facility] on [address] - closer to his home) to have them re-drawn.

## 2018-04-13 NOTE — Telephone Encounter (Signed)
The labs were collected per the instructions received from [facility] via a phone call.  Unfortunately, the only way to get the results is to collect the labs again per the updated instructions from the [facility].  These instructions will be verified with both the [place].  I have left Mr. Dershem a message requesting a call back.    Email from patient's husband: I just found the lack of results from the mishandled blood sample sent to the [facility] for analysis. What a terrible disappoint.  Is there a way to correct this technical error.?

## 2018-04-13 NOTE — Telephone Encounter (Addendum)
Spoke to [employer] at [employer].  She is unsure if they are able to draw home labs to send to [facility].  She is going to check with her supervisor and call me back.  Mr. Daw is aware of this update.

## 2018-04-14 NOTE — Telephone Encounter (Signed)
Shay returned my call from [facility] today.  Unfortunately, they are unable to draw for [facility] labs.

## 2018-04-14 NOTE — Telephone Encounter (Signed)
I have talked with patient's husband, she continue have steady decline, developed dysphagia, has difficulty swallowing pills, nutrition dependent on milkshake,

## 2018-04-17 ENCOUNTER — Telehealth: Payer: Self-pay | Admitting: Neurology

## 2018-04-17 NOTE — Telephone Encounter (Signed)
error 

## 2018-04-17 NOTE — Telephone Encounter (Signed)
[facility] clinical order was replaced, copy of lab order was mailed to patient's husband, [facility] (lab drawn agency), [facility] patient service center to sent out sample to [facility] clinic.

## 2018-04-25 NOTE — Telephone Encounter (Signed)
The orders were faxed and confirmed to [facility] on 04/17/18.  I have refaxed the order to them (along with the original confirmation sheet) to the verified fax number of [phone].  I also called Shay at [facility] who will be on the lookout for the orders.  She told me they have been having system errors and that may be why she could not see the order from 04/17/18.  I provided her with my name/number and requested she call me back if she does not receive the second faxed order today.  I also called Mr. Morfin to give him this update.

## 2018-04-25 NOTE — Telephone Encounter (Signed)
Pt husband has called to inform Dr Krista Blue that [facility] has informed him they have not received the necessary order to move forward.  Pt husband is asking for a call in order to move forward

## 2018-04-26 ENCOUNTER — Other Ambulatory Visit: Payer: Self-pay | Admitting: Neurology

## 2018-04-26 DIAGNOSIS — G909 Disorder of the autonomic nervous system, unspecified: Secondary | ICD-10-CM | POA: Diagnosis not present

## 2018-04-30 ENCOUNTER — Other Ambulatory Visit: Payer: Self-pay | Admitting: Neurology

## 2018-05-02 ENCOUNTER — Other Ambulatory Visit: Payer: Self-pay | Admitting: Neurology

## 2018-05-04 ENCOUNTER — Other Ambulatory Visit: Payer: Self-pay | Admitting: *Deleted

## 2018-05-04 ENCOUNTER — Other Ambulatory Visit: Payer: Self-pay

## 2018-05-04 MED ORDER — CARBIDOPA-LEVODOPA 25-100 MG PO TABS: ORAL_TABLET | ORAL | 0 refills | Status: DC

## 2018-05-06 IMAGING — CR DG CHEST 2V
2 series · 2 of 2 positions shown · non-contrast
Comparison: 08/02/2017

CLINICAL DATA: Cough.  Shortness of breath.  Hypertension.

EXAM:
CHEST  2 VIEW

[w chest pa]
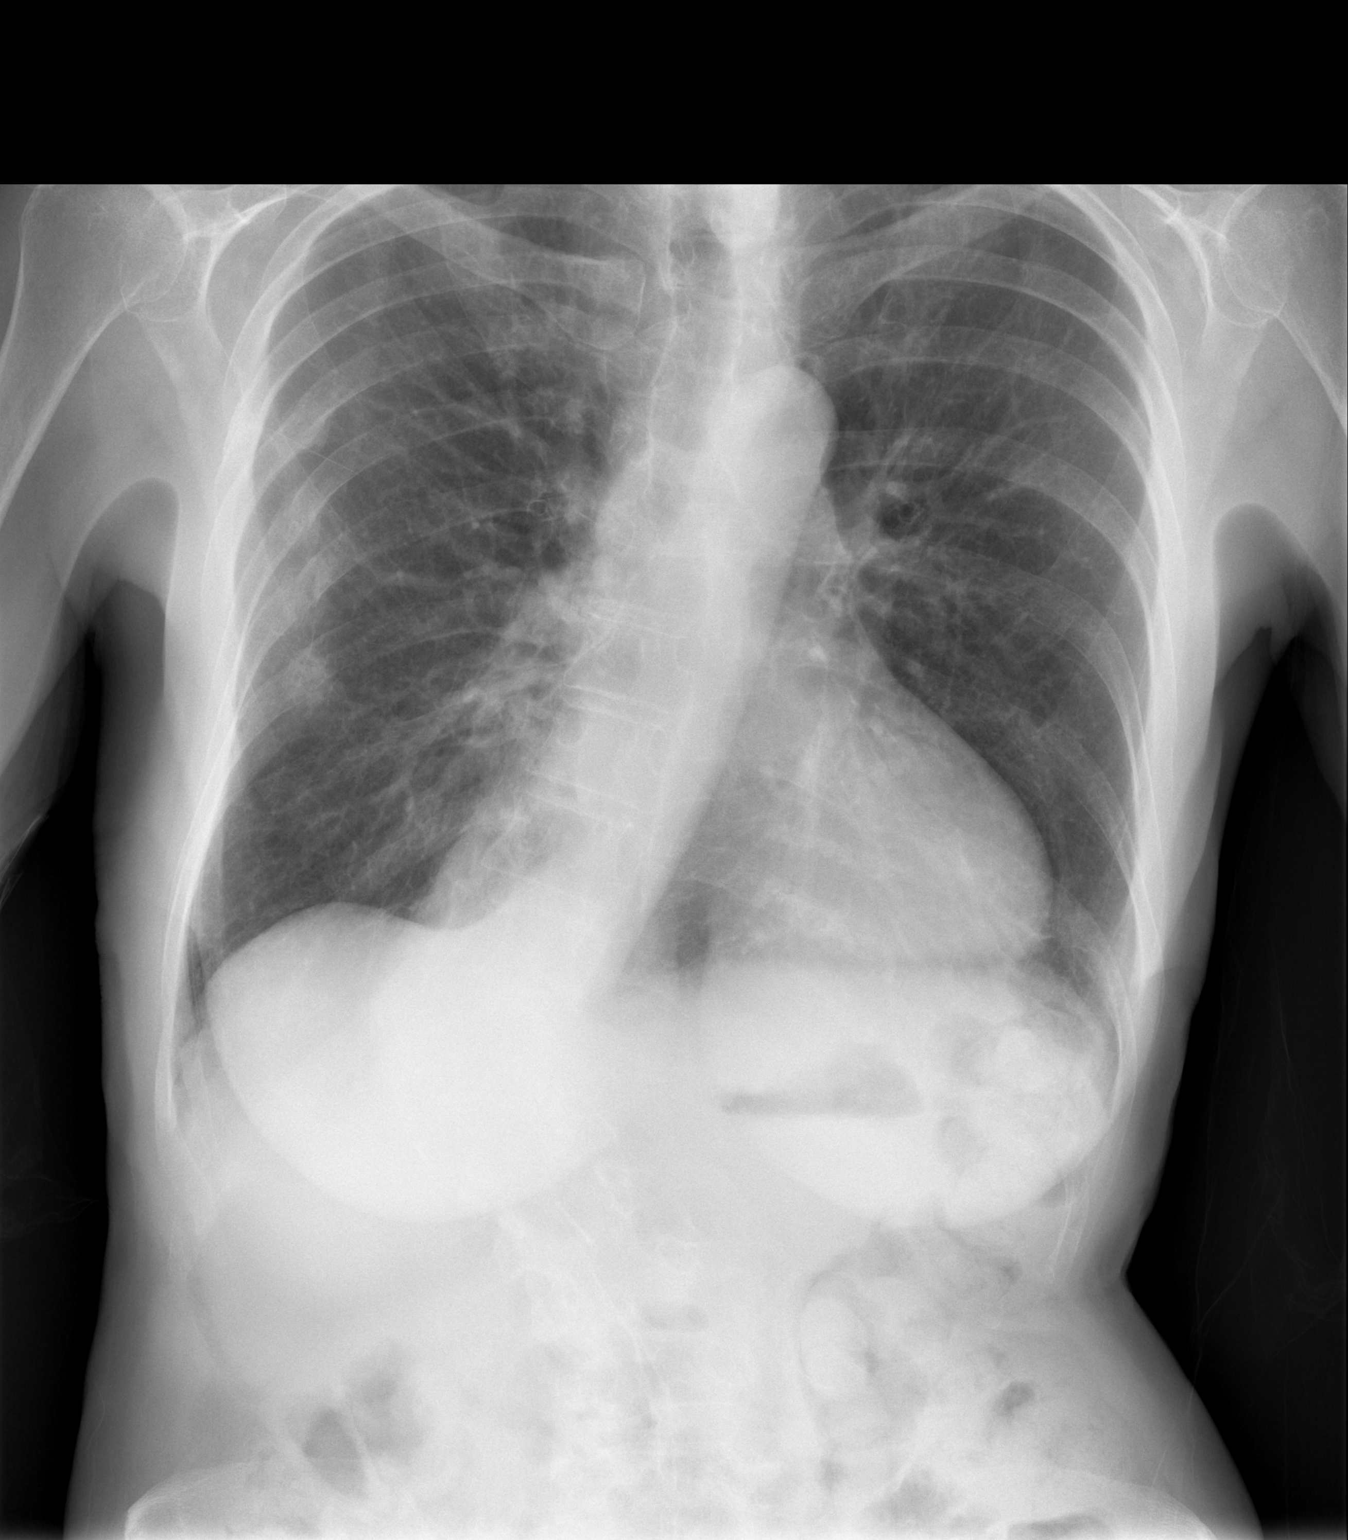

[w chest lat]
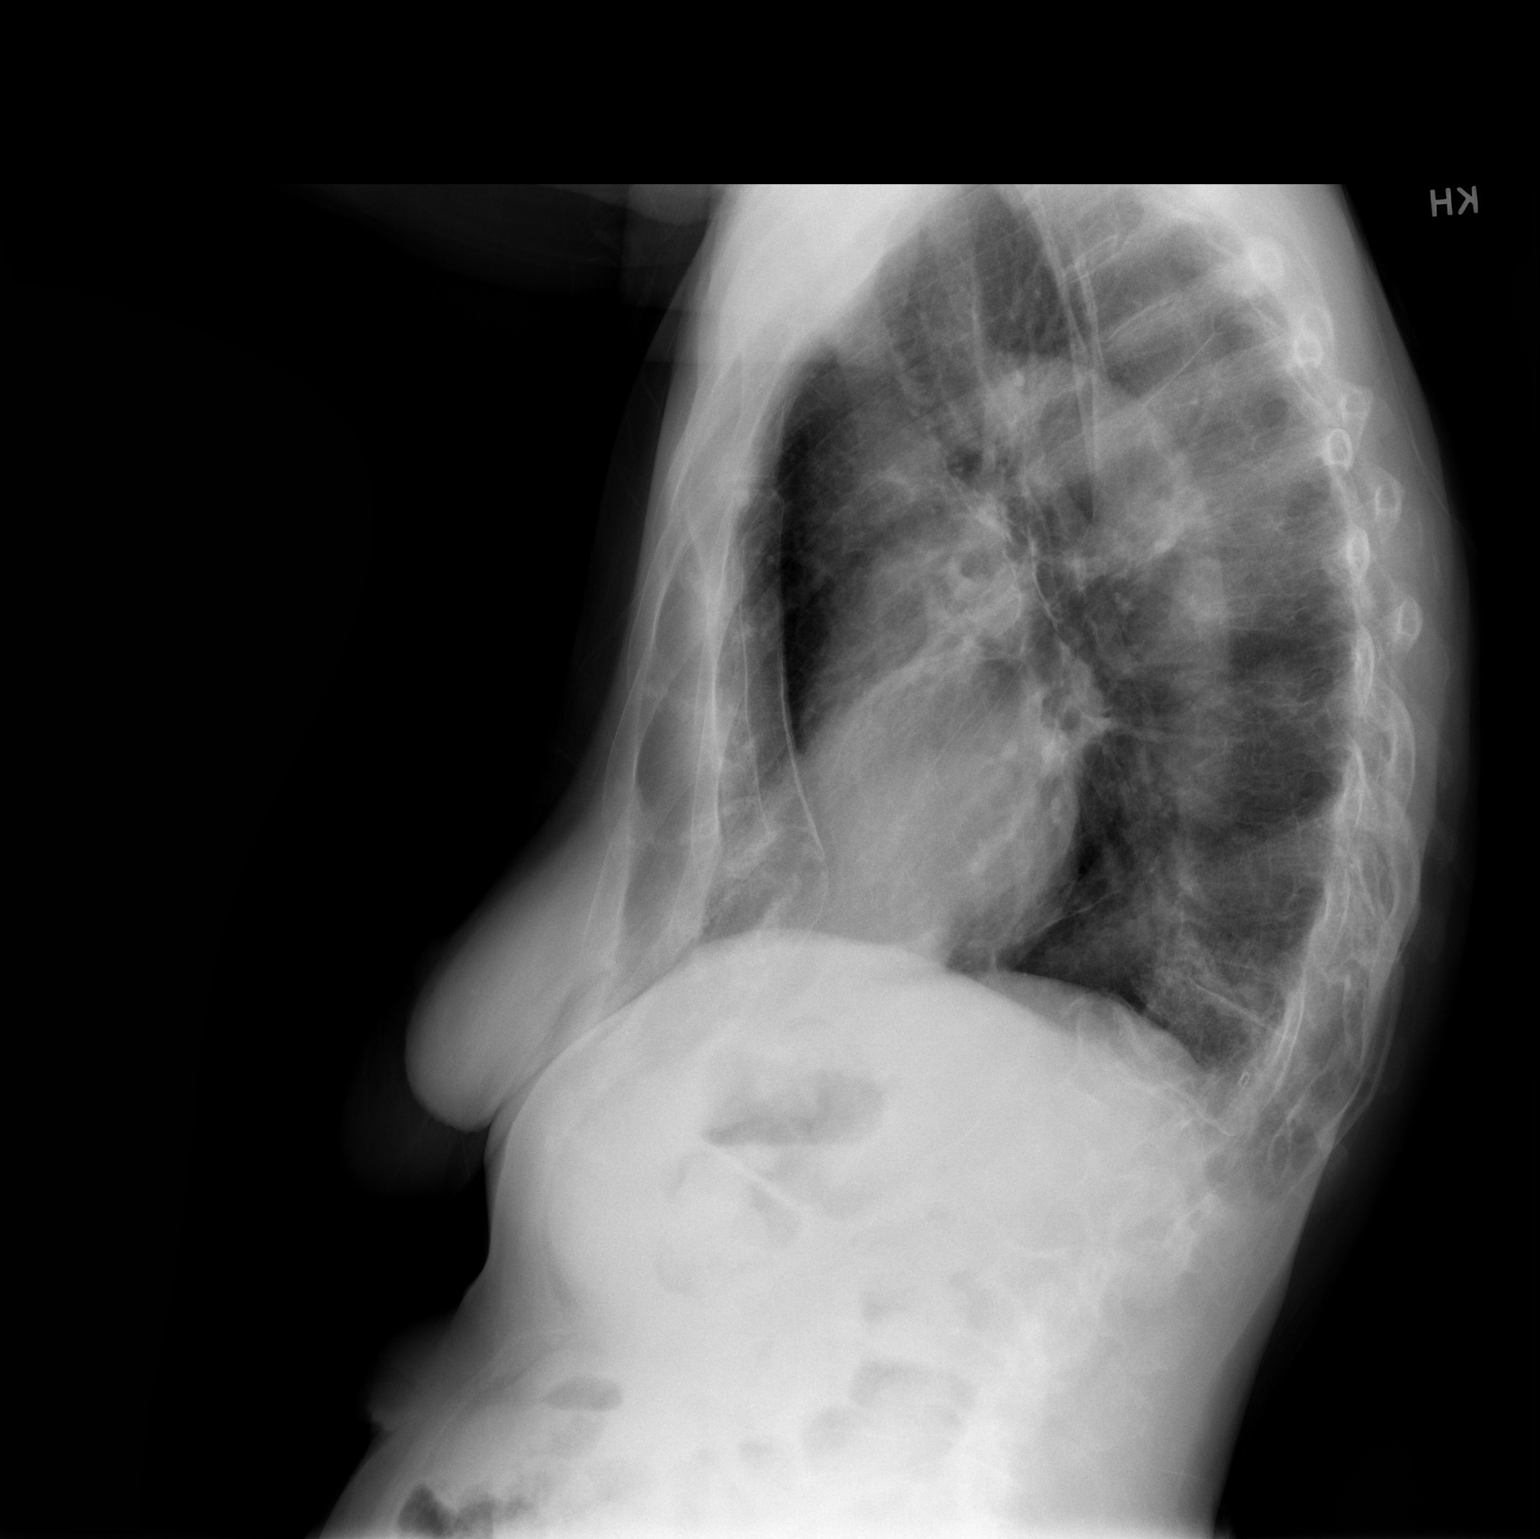

[2 of 2 positions shown; findings below may reference images not displayed]

FINDINGS: Heart size is normal. Both lungs are clear. No evidence of pleural
effusion. Multiple healing right rib fractures show increased
sclerosis, consistent with interval healing. Pectus excavatum noted.
On the lateral projection, lower thoracic and upper lumbar vertebral
body compression fractures are seen. Grade 2 anterolisthesis is seen
at 1 level near the thoracolumbar junction which was not seen on
recent thoracic spine MRI of 08/02/2017. Moderate thoracolumbar
dextroscoliosis again noted.
IMPRESSION: No active cardiopulmonary disease.

Subacute healing right rib fractures.

Several vertebral body compression fractures and retrolisthesis at 1
level near the thoracolumbar junction, not seen on recent MRI.
Recommend dedicated lower thoracic spine radiographs for further
evaluation.

## 2018-05-10 ENCOUNTER — Emergency Department (HOSPITAL_COMMUNITY): Payer: Medicare Other

## 2018-05-10 ENCOUNTER — Other Ambulatory Visit: Payer: Self-pay

## 2018-05-10 ENCOUNTER — Telehealth: Payer: Self-pay | Admitting: Neurology

## 2018-05-10 ENCOUNTER — Encounter (HOSPITAL_COMMUNITY): Payer: Self-pay

## 2018-05-10 ENCOUNTER — Other Ambulatory Visit: Payer: Self-pay | Admitting: *Deleted

## 2018-05-10 ENCOUNTER — Emergency Department (HOSPITAL_COMMUNITY)
Admission: EM | Admit: 2018-05-10 | Discharge: 2018-05-10 | Disposition: A | Discharge status: Home / Self Care | Payer: Medicare Other | Attending: Emergency Medicine | Admitting: Emergency Medicine

## 2018-05-10 DIAGNOSIS — G909 Disorder of the autonomic nervous system, unspecified: Secondary | ICD-10-CM

## 2018-05-10 DIAGNOSIS — M6281 Muscle weakness (generalized): Secondary | ICD-10-CM | POA: Diagnosis present

## 2018-05-10 DIAGNOSIS — R479 Unspecified speech disturbances: Secondary | ICD-10-CM

## 2018-05-10 DIAGNOSIS — Z79899 Other long term (current) drug therapy: Secondary | ICD-10-CM | POA: Diagnosis not present

## 2018-05-10 DIAGNOSIS — I959 Hypotension, unspecified: Secondary | ICD-10-CM | POA: Diagnosis not present

## 2018-05-10 DIAGNOSIS — G903 Multi-system degeneration of the autonomic nervous system: Secondary | ICD-10-CM

## 2018-05-10 DIAGNOSIS — R531 Weakness: Secondary | ICD-10-CM

## 2018-05-10 DIAGNOSIS — G239 Degenerative disease of basal ganglia, unspecified: Secondary | ICD-10-CM | POA: Diagnosis not present

## 2018-05-10 DIAGNOSIS — R1310 Dysphagia, unspecified: Secondary | ICD-10-CM

## 2018-05-10 DIAGNOSIS — R0602 Shortness of breath: Secondary | ICD-10-CM | POA: Diagnosis not present

## 2018-05-10 DIAGNOSIS — M6250 Muscle wasting and atrophy, not elsewhere classified, unspecified site: Secondary | ICD-10-CM | POA: Diagnosis not present

## 2018-05-10 DIAGNOSIS — I10 Essential (primary) hypertension: Secondary | ICD-10-CM | POA: Diagnosis not present

## 2018-05-10 DIAGNOSIS — R269 Unspecified abnormalities of gait and mobility: Secondary | ICD-10-CM

## 2018-05-10 LAB — BASIC METABOLIC PANEL
ANION GAP: 13 (ref 5–15)
BUN: 23 mg/dL (ref 8–23)
CO2: 25 mmol/L (ref 22–32)
Calcium: 10.7 mg/dL — ABNORMAL HIGH (ref 8.9–10.3)
Chloride: 106 mmol/L (ref 98–111)
Creatinine, Ser: 0.93 mg/dL (ref 0.44–1.00)
GFR calc Af Amer: >60 mL/min (ref >60)
GFR, EST NON AFRICAN AMERICAN: 60 mL/min — AB (ref >60)
GLUCOSE: 99 mg/dL (ref 70–99)
POTASSIUM: 3.8 mmol/L (ref 3.5–5.1)
Sodium: 144 mmol/L (ref 135–145)

## 2018-05-10 LAB — CBC WITH DIFFERENTIAL/PLATELET
Basophils Absolute: 0 10*3/uL (ref 0.0–0.1)
Basophils Relative: 0 %
Eosinophils Absolute: 0.1 10*3/uL (ref 0.0–0.7)
Eosinophils Relative: 1 %
HEMATOCRIT: 42.7 % (ref 36.0–46.0)
Hemoglobin: 14.4 g/dL (ref 12.0–15.0)
Lymphocytes Relative: 18 %
Lymphs Abs: 1.2 10*3/uL (ref 0.7–4.0)
MCH: 31.6 pg (ref 26.0–34.0)
MCHC: 33.7 g/dL (ref 30.0–36.0)
MCV: 93.6 fL (ref 78.0–100.0)
MONO ABS: 0.6 10*3/uL (ref 0.1–1.0)
MONOS PCT: 10 %
NEUTROS ABS: 4.7 10*3/uL (ref 1.7–7.7)
Neutrophils Relative %: 71 %
Platelets: 258 10*3/uL (ref 150–400)
RBC: 4.56 MIL/uL (ref 3.87–5.11)
RDW: 12.9 % (ref 11.5–15.5)
WBC: 6.7 10*3/uL (ref 4.0–10.5)

## 2018-05-10 LAB — BLOOD GAS, VENOUS
ACID-BASE EXCESS: 0.7 mmol/L (ref 0.0–2.0)
Bicarbonate: 22.1 mmol/L (ref 20.0–28.0)
O2 SAT: 61 %
PCO2 VEN: 28.5 mmHg — AB (ref 44.0–60.0)
PO2 VEN: 31 mmHg — AB (ref 32.0–45.0)
Patient temperature: 98.6
pH, Ven: 7.502 — ABNORMAL HIGH (ref 7.250–7.430)

## 2018-05-10 LAB — BRAIN NATRIURETIC PEPTIDE: B Natriuretic Peptide: 50.7 pg/mL (ref 0.0–100.0)

## 2018-05-10 LAB — I-STAT TROPONIN, ED: Troponin i, poc: 0 ng/mL (ref 0.00–0.08)

## 2018-05-10 LAB — I-STAT CG4 LACTIC ACID, ED: Lactic Acid, Venous: 1.21 mmol/L (ref 0.5–1.9)

## 2018-05-10 MED ORDER — IPRATROPIUM-ALBUTEROL 0.5-2.5 (3) MG/3ML IN SOLN
3.0000 mL | Freq: Once | RESPIRATORY_TRACT | Status: AC
  Administered 2018-05-10: 3 mL via RESPIRATORY_TRACT
  Filled 2018-05-10: qty 3

## 2018-05-10 NOTE — Care Management Note (Signed)
Case Management Note  CM noted pt was active with [facility]  CM contacted Georgina Snell who will follow for needs.  Matasha Smigelski, Benjaman Lobe, RN 05/10/2018, 3:05 PM

## 2018-05-10 NOTE — ED Triage Notes (Signed)
Pt has hx of neuro issues, with issues with cholinesterase and ganglions in the brain. Pt was sent to the ED today by home care because they were concerned with her BP- per family 57s sys. Pt has hx of orthostatic hypotension. Per family, this has been going on for 6 months to 1 year.

## 2018-05-10 NOTE — ED Notes (Signed)
Family member came out of room requesting someone to see the pt related to complaint of SOB. This writer went in to assess pt; pt does not appear to be in distress; VS obtained. Pfeiffer MD made aware of the above and verbalizes will come assess pt.

## 2018-05-10 NOTE — ED Provider Notes (Signed)
[facility] DEPT Provider Note   CSN: [id number] Arrival date & time: 05/10/18  1149     History   Chief Complaint Chief Complaint  Patient presents with  . Weakness    HPI Jodi Cardenas is a 72 y.o. female.  HPI Patient has history of MSA.  She has been expensing decline over the past several months to weeks.  Things have been worse over the past several days in terms of the patient feeling more short of breath.  They have also noticed that today her blood pressures were lower than usual.  Systolic pressures were measured in the 70s.  She is treated with fludrocortisone for hypotension.  She has not had recent fever.  She reports she goes to periods of feeling like she has mucus production and other times feeling very dry in the mouth.  She reports she feels very short of breath.  No lower extremity swelling or calf pain. Past Medical History:  Diagnosis Date  . Dizziness   . Hypercholesteremia   . Orthostatic hypotension   . Osteopenia   . Scoliosis     Patient Active Problem List   Diagnosis Date Noted  . Multiple system atrophy ([place]) 12/08/2017  . Autonomic dysfunction 09/19/2017  . Chronic thoracic spine pain 09/19/2017  . Disorder of the autonomic nervous system, unspecified 07/28/2017  . Gait abnormality 06/30/2017  . Chronic back pain 04/04/2017  . Osteopenia 04/04/2017  . Hyperlipidemia 02/25/2017  . Orthostatic hypotension 02/25/2017    Past Surgical History:  Procedure Laterality Date  . EYE SURGERY     lens Implants x 2  . RETINAL DETACHMENT SURGERY    . TONSILLECTOMY    . TUBAL LIGATION    . WRIST SURGERY Right      OB History   None      Home Medications    Prior to Admission medications   Medication Sig Start Date End Date Taking? Authorizing Provider  Acetaminophen (TYLENOL ARTHRITIS PAIN PO) Take 650 mg by mouth as needed (severe pain). Extended release    Yes [provider]  ALPRAZolam (XANAX)  0.25 MG tablet Take 1 tablet (0.25 mg total) by mouth 3 (three) times daily as needed for anxiety. 12/08/17  Yes Marcial Pacas, MD  ASTAXANTHIN PO Take 10 mg by mouth daily.   Yes [provider]  Biotin 2500 MCG CAPS Take by mouth 2 (two) times daily.   Yes [provider]  carbidopa-levodopa (SINEMET IR) 25-100 MG tablet TAKE UP TO 2 TABLETS BY MOUTH THREE TIMES A DAY 05/04/18  Yes Marcial Pacas, MD  Coenzyme Q-10 100 MG capsule Take 100 mg by mouth daily.   Yes [provider]  diclofenac sodium (VOLTAREN) 1 % GEL Apply 2 g topically daily as needed (back pain).   Yes [provider]  Fludrocortisone Acetate (FLORINEF PO) Take by mouth. Taking 0.1mg  daily.   Yes [provider]  GENISTEIN PO Take 125 mg by mouth daily.   Yes [provider]  guaiFENesin (MUCINEX) 600 MG 12 hr tablet Take 600 mg by mouth daily as needed for to loosen phlegm.   Yes [provider]  KRILL OIL PO Take 1 tablet by mouth daily.   Yes [provider]  Menthol, Topical Analgesic, (BIOFREEZE ROLL-ON EX) Apply 1 application topically daily as needed (back pain).   Yes [provider]  Multiple Vitamin (MULTIVITAMIN) capsule Take 1 capsule by mouth daily.   Yes [provider]  OVER THE COUNTER MEDICATION Take 250 mg by mouth daily. Resverartol   Yes [provider]  Probiotic Product (PROBIOTIC DAILY PO) Take by mouth.   Yes [provider]  sertraline (ZOLOFT) 25 MG tablet Take 3 tablets (75 mg total) by mouth daily. 03/14/18  Yes Marcial Pacas, MD  TURMERIC PO Take 1,000 mg by mouth daily.   Yes [provider]  Vitamin D, Ergocalciferol, 2000 units CAPS Take 2,000 Units by mouth daily.   Yes [provider]  oxycodone-acetaminophen (PERCOCET) 2.5-325 MG tablet Take 1 tablet by mouth every 8 (eight) hours as needed for pain. Patient not taking: Reported on 12/08/2017 09/19/17   Marcial Pacas, MD    Family  History Family History  Problem Relation Age of Onset  . Heart disease Father     Social History Social History   Tobacco Use  . Smoking status: Never Smoker  . Smokeless tobacco: Never Used  Substance Use Topics  . Alcohol use: Yes    Comment: occasional wine  . Drug use: No     Allergies   Patient has no known allergies.   Review of Systems Review of Systems 10 Systems reviewed and are negative for acute change except as noted in the HPI.   Physical Exam Updated Vital Signs BP 138/63   Pulse 86   Temp 97.6 F (36.4 C) (Oral)   Resp (!) 24   Ht 5\' 2"  (1.575 m)   Wt 52 kg   SpO2 100%   BMI 20.96 kg/m   Physical Exam  Constitutional:  Patient is alert.  She is nontoxic.  She does appear to have generalized physical atrophy  HENT:  Head: Normocephalic and atraumatic.  Dukas membranes slightly dry.  Airway is patent without any secretions or pulling.  Eyes: EOM are normal.  Chronic surgical anomaly to the left pupil.  Cardiovascular: Normal rate, regular rhythm, normal heart sounds and intact distal pulses.  Pulmonary/Chest:  Patient's breathing does not appear particularly labored.  She does repeatedly report feeling short of breath.  Adequate airflow both lung fields somewhat diminished on the left.  No gross rhonchi or rale.  Abdominal: Soft. She exhibits no distension. There is no tenderness. There is no guarding.  Musculoskeletal:  Patient's lower extremities are in good condition.  No peripheral edema.  Calves are soft and nontender.  Patient is exhibiting generalized muscular atrophy.  He is kyphotic.  Neurological:  Patient is alert but seems to have some speech impediment with some redundancy of sounds that she is trying to speak sounds like vocal cord weakness.  speach is intelligible.  Patient is generally very weak.  This however appears to be chronic secondary to her underlying disease.  No focal flaccid paralysis.  Skin: Skin is warm and dry.    Psychiatric:  Patient slightly anxious.     ED Treatments / Results  Labs (all labs ordered are listed, but only abnormal results are displayed) Labs Reviewed  BASIC METABOLIC PANEL - Abnormal; Notable for the following components:      Result Value   Calcium 10.7 (*)    GFR calc non Af Amer 60 (*)    All other components within normal limits  BLOOD GAS, VENOUS - Abnormal; Notable for the following components:   pH, Ven 7.502 (*)    pCO2, Ven 28.5 (*)    pO2, Ven 31.0 (*)    All other components within normal limits  BRAIN NATRIURETIC PEPTIDE  CBC WITH DIFFERENTIAL/PLATELET  I-STAT CG4 LACTIC ACID, ED  I-STAT TROPONIN, ED  I-STAT CG4 LACTIC ACID, ED    EKG EKG Interpretation  Date/Time:  Wednesday May 10 2018 13:47:43 EDT Ventricular Rate:  67 PR Interval:    QRS Duration: 169 QT Interval:  471 QTC Calculation: 494 R Axis:   12 Text Interpretation:  Sinus rhythm extensive artifact. QRS morphology no chnage fromprevious Confirmed by Charlesetta Shanks [phone]) on 05/10/2018 3:40:23 PM   Radiology Dg Chest Port 1 View  Result Date: 05/10/2018 CLINICAL DATA:  Shortness of breath EXAM: PORTABLE CHEST 1 VIEW COMPARISON:  10/15/2017 FINDINGS: No focal opacity or pleural effusion. Normal cardiomediastinal silhouette. No pneumothorax. Old right-sided rib fractures. IMPRESSION: No active disease. Electronically Signed   By: Donavan Foil M.D.   On: 05/10/2018 14:19    Procedures Procedures (including critical care time)  Medications Ordered in ED Medications  ipratropium-albuterol (DUONEB) 0.5-2.5 (3) MG/3ML nebulizer solution 3 mL (3 mLs Nebulization Given 05/10/18 1351)     Initial Impression / Assessment and Plan / ED Course  I have reviewed the triage vital signs and the nursing notes.  Pertinent labs & imaging results that were available during my care of the patient were reviewed by me and considered in my medical decision making (see chart for details).     Diagnostic work-up within normal limits.  Patient's oxygen saturation normal vital signs normal.  Patient likely has hypoventilation due to muscular weakness from her underlying MSA.  His case is very complex.  Appears to be a chronic progression of symptoms.  Family members are well versed in caring for the patient.  Return precautions reviewed.  Patient has multiple specialty follow-ups and ongoing care.  Final Clinical Impressions(s) / ED Diagnoses   Final diagnoses:  Multiple system atrophy ([place])  Shortness of breath  Hypotension, unspecified hypotension type    ED Discharge Orders    None       Charlesetta Shanks, MD 05/10/18 [phone]

## 2018-05-11 ENCOUNTER — Telehealth: Payer: Self-pay | Admitting: Neurology

## 2018-05-11 NOTE — Telephone Encounter (Signed)
Please call patient, [place] clinic panel come back negative,  She went to emergency room yesterday

## 2018-05-11 NOTE — Telephone Encounter (Signed)
I called patient's husband for negative [facility] panel, patient continued to get worse,

## 2018-05-16 DIAGNOSIS — G903 Multi-system degeneration of the autonomic nervous system: Secondary | ICD-10-CM | POA: Diagnosis not present

## 2018-05-16 DIAGNOSIS — M419 Scoliosis, unspecified: Secondary | ICD-10-CM | POA: Diagnosis not present

## 2018-05-18 ENCOUNTER — Telehealth: Payer: Self-pay | Admitting: Neurology

## 2018-05-18 DIAGNOSIS — G909 Disorder of the autonomic nervous system, unspecified: Secondary | ICD-10-CM

## 2018-05-18 NOTE — Telephone Encounter (Signed)
I called [facility] ([phone], fax: [phone]).  They just need a referral sent over to them then a PA or NP will go out to the patient's home to assess her for palliative care.  Referral order placed in Epic.  I spoke to Mr. Marcou and he is aware to expect a call from them.

## 2018-05-18 NOTE — Telephone Encounter (Signed)
Please call hospice and pallative care of  for [place] to initiate the process.    call [phone] or contact us online here.

## 2018-05-19 DIAGNOSIS — G903 Multi-system degeneration of the autonomic nervous system: Secondary | ICD-10-CM | POA: Diagnosis not present

## 2018-05-19 DIAGNOSIS — M419 Scoliosis, unspecified: Secondary | ICD-10-CM | POA: Diagnosis not present

## 2018-05-23 ENCOUNTER — Telehealth: Payer: Self-pay | Admitting: Licensed Clinical Social Worker

## 2018-05-23 DIAGNOSIS — M419 Scoliosis, unspecified: Secondary | ICD-10-CM | POA: Diagnosis not present

## 2018-05-23 DIAGNOSIS — G903 Multi-system degeneration of the autonomic nervous system: Secondary | ICD-10-CM | POA: Diagnosis not present

## 2018-05-23 NOTE — Telephone Encounter (Signed)
Palliative Care SW spoke with patient's husband and planned a home visit for 05/24/18 at 12pm.

## 2018-05-24 ENCOUNTER — Other Ambulatory Visit: Payer: Medicare Other | Admitting: Licensed Clinical Social Worker

## 2018-05-24 ENCOUNTER — Other Ambulatory Visit: Payer: Medicare Other | Admitting: *Deleted

## 2018-05-24 DIAGNOSIS — Z515 Encounter for palliative care: Secondary | ICD-10-CM

## 2018-05-24 NOTE — Progress Notes (Signed)
COMMUNITY PALLIATIVE CARE SW NOTE  PATIENT NAME: Jodi Cardenas DOB: [birth year] MRN: [id number]  PRIMARY CARE PROVIDER: Leighton Ruff, MD  RESPONSIBLE PARTY:  Acct ID - Guarantor Home Phone Work Phone Relationship Acct Type  [id number] Jodi Cardenas, Jodi Cardenas* [phone]  Self P/F     [place], Jodi Cardenas, Jodi Cardenas [id number]     PLAN OF CARE and INTERVENTIONS:             1. GOALS OF CARE/ ADVANCE CARE PLANNING:  For patient to remain at home with her husband.  Patient is a full code. 2. SOCIAL/EMOTIONAL/SPIRITUAL ASSESSMENT/ INTERVENTIONS:  SW and Palliative Care RN, Jodi Cardenas, met with patient and her husband.  Patient's son was also present at the end of the visit.  Patient was lying in bed and stated she was "not very social today".  She denied pain. SW met with patient's husband, Jodi Cardenas, separately.  He provided an extensive medical history of patient and her disease.  He is a retired Programmer, systems with patient's medications and nutritional needs.  He reports patient's anxiety has increased.  Patient and her husband met when they were in junior high school in [place].  Patient later became an Therapist, sports.  They have been married for 52 years.  Jodi Cardenas has heart issues and back problems which inhibit his care of patient.  The team encouraged him to arrange for more assistance.  One of patient's sons lives with them part time and also assists in patient care.  Patient has another son in [place] and a daughter in [place]. 3. PATIENT/CAREGIVER EDUCATION/ COPING:  Patient and her husband both express their feelings openly.  Provided education regarding the Palliative Care program. 4. PERSONAL EMERGENCY PLAN:  Patient informs her husband of medical concerns. 5. COMMUNITY RESOURCES COORDINATION/ HEALTH CARE NAVIGATION:  Patient has a CNA four hours a day from [place]. 6. FINANCIAL/LEGAL CONCERNS/INTERVENTIONS:  None per husband.     SOCIAL HX:  Social History   Tobacco Use  . Smoking  status: Never Smoker  . Smokeless tobacco: Never Used  Substance Use Topics  . Alcohol use: Yes    Comment: occasional wine    CODE STATUS:  Full Code  ADVANCED DIRECTIVES:  Living Will MOST FORM COMPLETE:  No HOSPICE EDUCATION PROVIDED:  Husband stated he was unfamiliar with Hospice. PPS:  Husband reports patient's intake is normal for what she is able to swallow.  She is a one person assist for transfers and w/c bound.      Creola Corn Alarik Radu, LCSW

## 2018-05-25 LAB — PARANEOPLASTIC AUTOAB EVAL
ACHR GANGLIONIC NEURONAL AB,S: 0 nmol/L (ref <=0.02)
ANTI-GLIAL NUCLEAR AB,TYPE 1: NEGATIVE {titer} (ref <1:240)
Amphiphysin Ab,S: NEGATIVE titer
Anti-Glial Nuclear Ab, Type 2: NEGATIVE titer
Anti-Glial Nuclear Ab, Type 3: NEGATIVE titer
Anti-Neuronal Nucl .Ab, Type 1: NEGATIVE titer
CALCIUM CHANNEL, AB N-TYPE: 0 nmol/L (ref <=0.03)
CRMP-5 IGG, S: NEGATIVE {titer} (ref <1:240)
Calcium Channel, Ab P/Q-Type: 0 nmol/L (ref <=0.02)
Neuronal (V + G) K+ Channel Ab,S: 0 nmol/L (ref <=0.02)
PURKINJE CELL CYTOP.AB TYPE 1: NEGATIVE {titer} (ref <1:240)
PURKINJE CELL CYTOP.AB TYPE TR: NEGATIVE {titer} (ref <1:240)
Purkinje Cell Cytop.Ab Type 2: NEGATIVE titer
STRIATIONAL ( STRIAT.MUSCLE),AB: NEGATIVE {titer} (ref <1:120)

## 2018-05-25 LAB — SPECIMEN STATUS REPORT

## 2018-05-26 DIAGNOSIS — G903 Multi-system degeneration of the autonomic nervous system: Secondary | ICD-10-CM | POA: Diagnosis not present

## 2018-05-26 DIAGNOSIS — M419 Scoliosis, unspecified: Secondary | ICD-10-CM | POA: Diagnosis not present

## 2018-05-29 DIAGNOSIS — M419 Scoliosis, unspecified: Secondary | ICD-10-CM | POA: Diagnosis not present

## 2018-05-29 DIAGNOSIS — G903 Multi-system degeneration of the autonomic nervous system: Secondary | ICD-10-CM | POA: Diagnosis not present

## 2018-05-29 NOTE — Progress Notes (Signed)
COMMUNITY PALLIATIVE CARE RN NOTE  PATIENT NAME: Jodi Cardenas DOB: 1946/04/22 MRN: [id number]  PRIMARY CARE PROVIDER: Leighton Ruff, MD  RESPONSIBLE PARTY:  Acct ID - Guarantor Home Phone Work Phone Relationship Acct Type  [id number] HAIDE, KLINKER* [phone]  Self P/F     [address], [place], [place] [address]    PLAN OF CARE and INTERVENTION:  1. ADVANCE CARE PLANNING/GOALS OF CARE: Remain at home with her husband and avoid hospitalizations 2. PATIENT/CAREGIVER EDUCATION: Explained Palliative Care Services, Reinforced Safe Transfers 3. DISEASE STATUS: Joint visit made with Palliative Care SW, Lynn Duffy. Met with husband in the front room initially, as patient states that she does not feel well and does not feel like engaging much in conversation today. Husband provided health history and reviewed current medications. Patient did agree for this RN to perform assessment in her bedroom. Patient lying down, propped up on pillows. She reports moderate generalized pain and burning/sharp neuropathic pain in both feet. Patient currently taking Tylenol Arthritis, which takes the edge off. Husband reports she is highly anxious and is currently taking Xanax TID, but does not feel that it is as effective as it used to be. She has hired caregivers 4 hours 7 days/week to assist with personal care needs. She is currently working with PT in the home who visited yesterday. Caregiver also states afterwards giving patient a shower. Today she is very weak and just wanting to sleep. She requires total care for ADLs, but is able to hold a cup to drink. She is unable to eat solid foods, due to dysphagia, so mainly drinks protein shakes and nutritional supplements. She is however still able to take her medications whole. She c/o her mouth feeling dry often throughout visit and requesting sips of her drink. I also applied lip moisturizer while there to help. She has a nonproductive cough. Lungs clear to ausculation.  Husband is considering increasing caregiver hours. They have a son who lives with them that helps out, however there are times where he has to tend to his business, so may not always be available at times needed. Husband plans to contact PCP to discuss pain and anxiety. Will continue to monitor.   HISTORY OF PRESENT ILLNESS:  This is a 72 yo female who resides at home with her husband. She has hired caregivers for 4 hours 7 days/week. Palliative Care Team now following patient to assist with symptom management needs and support. Will visit patient monthly and PRN.  CODE STATUS: FULL CODE ADVANCED DIRECTIVES: Y MOST FORM: no PPS: 30%    PHYSICAL EXAM:   VITALS: Today's Vitals   05/24/18 1337  BP: 128/70  Pulse: 71  Resp: 18  SpO2: 97%  PainSc: 5   PainLoc: Generalized    LUNGS: clear to auscultation  CARDIAC: Cor RRR EXTREMITIES: No edema SKIN: Exposed skin is dry and intact  NEURO: Alert and oriented x 3, flat affect, increasing generalized weakness, total care   (Duration of visit and documentation 90 minutes)    Daryl Eastern, RN, BSN

## 2018-06-01 ENCOUNTER — Other Ambulatory Visit: Payer: Self-pay | Admitting: Neurology

## 2018-06-01 DIAGNOSIS — G903 Multi-system degeneration of the autonomic nervous system: Secondary | ICD-10-CM | POA: Diagnosis not present

## 2018-06-01 DIAGNOSIS — M419 Scoliosis, unspecified: Secondary | ICD-10-CM | POA: Diagnosis not present

## 2018-06-03 ENCOUNTER — Other Ambulatory Visit: Payer: Self-pay | Admitting: Neurology

## 2018-06-04 NOTE — Telephone Encounter (Signed)
I received a call from patient`s husband via answering service requesting a few pills of xanax as patient wa sout of it and he was in process of changing pharmacies. Unfortunately I was unable to eprescibe after hours and asked him to call on Monday with address of his new pharmacy for office to take care of this.He voiced understanding

## 2018-06-05 ENCOUNTER — Other Ambulatory Visit: Payer: Self-pay | Admitting: Neurology

## 2018-06-05 MED ORDER — ALPRAZOLAM 0.25 MG PO TABS: 0.2500 mg | ORAL_TABLET | Freq: Three times a day (TID) | ORAL | 5 refills | Status: AC | PRN

## 2018-06-05 NOTE — Addendum Note (Signed)
Addended by: Belinda Block A on: 06/05/2018 11:48 AM   Modules accepted: Orders

## 2018-06-05 NOTE — Telephone Encounter (Signed)
Rx registry checked. Last filled on 05/03/18 for #90. Pharmacy confirmed.

## 2018-06-05 NOTE — Telephone Encounter (Signed)
Pt husband(on DPR-Bautch,James) states pt is in need of a refill on her ALPRAZolam (XANAX) 0.25 MG tablet.  [id number] - [place], [place] - [address] [place] [address] [phone] (Phone) [phone] (Fax)     He states she has been without it for a day and there has been a noticeable difference.

## 2018-06-06 DIAGNOSIS — G903 Multi-system degeneration of the autonomic nervous system: Secondary | ICD-10-CM | POA: Diagnosis not present

## 2018-06-06 DIAGNOSIS — M419 Scoliosis, unspecified: Secondary | ICD-10-CM | POA: Diagnosis not present

## 2018-06-07 ENCOUNTER — Other Ambulatory Visit: Payer: Self-pay | Admitting: Neurology

## 2018-06-07 DIAGNOSIS — M419 Scoliosis, unspecified: Secondary | ICD-10-CM | POA: Diagnosis not present

## 2018-06-07 DIAGNOSIS — G903 Multi-system degeneration of the autonomic nervous system: Secondary | ICD-10-CM | POA: Diagnosis not present

## 2018-06-07 MED ORDER — ZOLPIDEM TARTRATE 5 MG PO TABS: 5.0000 mg | ORAL_TABLET | Freq: Every evening | ORAL | 3 refills | Status: AC | PRN

## 2018-06-07 NOTE — Progress Notes (Signed)
Please call [name], I have ordered ambien 5mg  qhs as needed for her insomnia, advise him do not take it together with xanax to avoid oversedate side effect, including respiratory suppression.

## 2018-06-08 ENCOUNTER — Telehealth: Payer: Self-pay | Admitting: *Deleted

## 2018-06-08 NOTE — Telephone Encounter (Signed)
Dr. Krista Blue received an email from this patient's husband requesting a prescription for Ambien 5mg .  She has sent this medication to the pharmacy for her to take one tablet at bedtime as needed.  The patient is also taking Xanax 0.25mg  TID for anxiety.  The medication is prescribed by Dr. Krista Blue.  She wanted the patient's husband to use the Ambien sparingly, especially due to her taking Xanax regularly.  She did not want her to become over sedated.  Reviewed this information with Mr. Sergent and he verbalized understanding.  States he only plans to use the Ambien on a limited basis when her Xanax is not helpful enough.  States that when her anxiety is heightened, she has difficulty sleeping, eating and taking her medications.

## 2018-06-14 DIAGNOSIS — G903 Multi-system degeneration of the autonomic nervous system: Secondary | ICD-10-CM | POA: Diagnosis not present

## 2018-06-14 DIAGNOSIS — M419 Scoliosis, unspecified: Secondary | ICD-10-CM | POA: Diagnosis not present

## 2018-06-16 ENCOUNTER — Telehealth: Payer: Self-pay

## 2018-06-16 ENCOUNTER — Telehealth: Payer: Self-pay | Admitting: Neurology

## 2018-06-16 DIAGNOSIS — G20 Parkinson's disease: Secondary | ICD-10-CM | POA: Diagnosis not present

## 2018-06-16 DIAGNOSIS — R1310 Dysphagia, unspecified: Secondary | ICD-10-CM | POA: Diagnosis not present

## 2018-06-16 DIAGNOSIS — E46 Unspecified protein-calorie malnutrition: Secondary | ICD-10-CM | POA: Diagnosis not present

## 2018-06-16 DIAGNOSIS — F339 Major depressive disorder, recurrent, unspecified: Secondary | ICD-10-CM | POA: Diagnosis not present

## 2018-06-16 DIAGNOSIS — G903 Multi-system degeneration of the autonomic nervous system: Secondary | ICD-10-CM | POA: Diagnosis not present

## 2018-06-16 DIAGNOSIS — N390 Urinary tract infection, site not specified: Secondary | ICD-10-CM | POA: Diagnosis not present

## 2018-06-16 NOTE — Telephone Encounter (Addendum)
I failed to reach patient and her husband, left message.   Message       ----- Message -----  From: [name]  Sent: 06/14/2018  3:40 PM EDT  To: [facility] Clinical Pool  Subject: Visit Follow-Up Question               Dear Dr. Krista Blue,    [name] is getting worst. With your experience with MSA, can you help me? In the last week, [name] has not been always able to take her prescribed meds in apple sauce as intact tablets or the antioxidants. I am not sure whether it is she is losing her ability to shallow or it is caused by anxiety. (Although changing the dose regimen of Xanax to 0.50 mg TID or decreasing the interval between doses, overcome the necessity of grinding up the pill). Unfortunately, she is down to a liquid diet at this time, I can not get her to drink enough to maintain her weight, and she basically needs someone to help in every activity. Would further sedation or Valium be of value?  I have employed [name] care for 4 hours/5 days/week, but they have provide inconsistent support. Although we had thought that in-home care was the way to go, I cannot handle all situations as she continues to digresses. What do you recommend? Hospital and facility care or just Facility care? Specifics?   The increased swallowing difficulty most likely due to her underlying disease of multiple system atrophy, anxiety, deconditioning play a partial role, I do not think higher dose of Xanax would benefit for her, higher dose of benzodiazepine might cause more sedation, and even respiratory suppression.  1.There are options of feeding tube, PEG tube, if patient and family agree  2.The in-home care options:   Production assistant, radio,    [place],    [facility]    [place]   [phone]   [facility]    [place]  [phone]   3. For the facility placement, patient usually has to be admitted to the hospital first

## 2018-06-16 NOTE — Telephone Encounter (Signed)
Received phone call from [place] with [facility] who is in the home with patient and her husband. Lattie Haw shared that patient has demonstrated decline with worsening dysphagia and increased weakness. Patient and her husband is unsure of what direction to take at this point. Plan is to notify Palliative Care primary team and for them to contact Lattie Haw who is still in the home. Primary Palliative team updated

## 2018-06-17 DIAGNOSIS — N390 Urinary tract infection, site not specified: Secondary | ICD-10-CM | POA: Diagnosis not present

## 2018-06-17 DIAGNOSIS — G20 Parkinson's disease: Secondary | ICD-10-CM | POA: Diagnosis not present

## 2018-06-17 DIAGNOSIS — F339 Major depressive disorder, recurrent, unspecified: Secondary | ICD-10-CM | POA: Diagnosis not present

## 2018-06-17 DIAGNOSIS — E46 Unspecified protein-calorie malnutrition: Secondary | ICD-10-CM | POA: Diagnosis not present

## 2018-06-17 DIAGNOSIS — G903 Multi-system degeneration of the autonomic nervous system: Secondary | ICD-10-CM | POA: Diagnosis not present

## 2018-06-17 DIAGNOSIS — R1310 Dysphagia, unspecified: Secondary | ICD-10-CM | POA: Diagnosis not present

## 2018-06-18 DIAGNOSIS — R1310 Dysphagia, unspecified: Secondary | ICD-10-CM | POA: Diagnosis not present

## 2018-06-18 DIAGNOSIS — G903 Multi-system degeneration of the autonomic nervous system: Secondary | ICD-10-CM | POA: Diagnosis not present

## 2018-06-18 DIAGNOSIS — F339 Major depressive disorder, recurrent, unspecified: Secondary | ICD-10-CM | POA: Diagnosis not present

## 2018-06-18 DIAGNOSIS — E46 Unspecified protein-calorie malnutrition: Secondary | ICD-10-CM | POA: Diagnosis not present

## 2018-06-18 DIAGNOSIS — N390 Urinary tract infection, site not specified: Secondary | ICD-10-CM | POA: Diagnosis not present

## 2018-06-18 DIAGNOSIS — G20 Parkinson's disease: Secondary | ICD-10-CM | POA: Diagnosis not present

## 2018-06-19 DIAGNOSIS — G903 Multi-system degeneration of the autonomic nervous system: Secondary | ICD-10-CM | POA: Diagnosis not present

## 2018-06-19 DIAGNOSIS — F339 Major depressive disorder, recurrent, unspecified: Secondary | ICD-10-CM | POA: Diagnosis not present

## 2018-06-19 DIAGNOSIS — E46 Unspecified protein-calorie malnutrition: Secondary | ICD-10-CM | POA: Diagnosis not present

## 2018-06-19 DIAGNOSIS — R1310 Dysphagia, unspecified: Secondary | ICD-10-CM | POA: Diagnosis not present

## 2018-06-19 DIAGNOSIS — G20 Parkinson's disease: Secondary | ICD-10-CM | POA: Diagnosis not present

## 2018-06-19 DIAGNOSIS — N390 Urinary tract infection, site not specified: Secondary | ICD-10-CM | POA: Diagnosis not present

## 2018-06-22 DIAGNOSIS — F339 Major depressive disorder, recurrent, unspecified: Secondary | ICD-10-CM | POA: Diagnosis not present

## 2018-06-22 DIAGNOSIS — E46 Unspecified protein-calorie malnutrition: Secondary | ICD-10-CM | POA: Diagnosis not present

## 2018-06-22 DIAGNOSIS — N390 Urinary tract infection, site not specified: Secondary | ICD-10-CM | POA: Diagnosis not present

## 2018-06-22 DIAGNOSIS — R1310 Dysphagia, unspecified: Secondary | ICD-10-CM | POA: Diagnosis not present

## 2018-06-22 DIAGNOSIS — G903 Multi-system degeneration of the autonomic nervous system: Secondary | ICD-10-CM | POA: Diagnosis not present

## 2018-06-22 DIAGNOSIS — G20 Parkinson's disease: Secondary | ICD-10-CM | POA: Diagnosis not present

## 2018-06-23 DIAGNOSIS — G20 Parkinson's disease: Secondary | ICD-10-CM | POA: Diagnosis not present

## 2018-06-23 DIAGNOSIS — E46 Unspecified protein-calorie malnutrition: Secondary | ICD-10-CM | POA: Diagnosis not present

## 2018-06-23 DIAGNOSIS — G903 Multi-system degeneration of the autonomic nervous system: Secondary | ICD-10-CM | POA: Diagnosis not present

## 2018-06-23 DIAGNOSIS — R1310 Dysphagia, unspecified: Secondary | ICD-10-CM | POA: Diagnosis not present

## 2018-06-23 DIAGNOSIS — F339 Major depressive disorder, recurrent, unspecified: Secondary | ICD-10-CM | POA: Diagnosis not present

## 2018-06-23 DIAGNOSIS — N390 Urinary tract infection, site not specified: Secondary | ICD-10-CM | POA: Diagnosis not present

## 2018-06-25 ENCOUNTER — Other Ambulatory Visit: Payer: Self-pay | Admitting: Neurology

## 2018-06-26 DIAGNOSIS — N390 Urinary tract infection, site not specified: Secondary | ICD-10-CM | POA: Diagnosis not present

## 2018-06-26 DIAGNOSIS — G20 Parkinson's disease: Secondary | ICD-10-CM | POA: Diagnosis not present

## 2018-06-26 DIAGNOSIS — R1310 Dysphagia, unspecified: Secondary | ICD-10-CM | POA: Diagnosis not present

## 2018-06-26 DIAGNOSIS — G903 Multi-system degeneration of the autonomic nervous system: Secondary | ICD-10-CM | POA: Diagnosis not present

## 2018-06-26 DIAGNOSIS — F339 Major depressive disorder, recurrent, unspecified: Secondary | ICD-10-CM | POA: Diagnosis not present

## 2018-06-26 DIAGNOSIS — E46 Unspecified protein-calorie malnutrition: Secondary | ICD-10-CM | POA: Diagnosis not present

## 2018-06-27 DIAGNOSIS — E46 Unspecified protein-calorie malnutrition: Secondary | ICD-10-CM | POA: Diagnosis not present

## 2018-06-27 DIAGNOSIS — G20 Parkinson's disease: Secondary | ICD-10-CM | POA: Diagnosis not present

## 2018-06-27 DIAGNOSIS — F339 Major depressive disorder, recurrent, unspecified: Secondary | ICD-10-CM | POA: Diagnosis not present

## 2018-06-27 DIAGNOSIS — G903 Multi-system degeneration of the autonomic nervous system: Secondary | ICD-10-CM | POA: Diagnosis not present

## 2018-06-27 DIAGNOSIS — R1310 Dysphagia, unspecified: Secondary | ICD-10-CM | POA: Diagnosis not present

## 2018-06-27 DIAGNOSIS — N390 Urinary tract infection, site not specified: Secondary | ICD-10-CM | POA: Diagnosis not present

## 2018-06-29 DIAGNOSIS — G903 Multi-system degeneration of the autonomic nervous system: Secondary | ICD-10-CM | POA: Diagnosis not present

## 2018-06-29 DIAGNOSIS — F339 Major depressive disorder, recurrent, unspecified: Secondary | ICD-10-CM | POA: Diagnosis not present

## 2018-06-29 DIAGNOSIS — N390 Urinary tract infection, site not specified: Secondary | ICD-10-CM | POA: Diagnosis not present

## 2018-06-29 DIAGNOSIS — G20 Parkinson's disease: Secondary | ICD-10-CM | POA: Diagnosis not present

## 2018-06-29 DIAGNOSIS — E46 Unspecified protein-calorie malnutrition: Secondary | ICD-10-CM | POA: Diagnosis not present

## 2018-06-29 DIAGNOSIS — R1310 Dysphagia, unspecified: Secondary | ICD-10-CM | POA: Diagnosis not present

## 2018-06-30 DIAGNOSIS — G903 Multi-system degeneration of the autonomic nervous system: Secondary | ICD-10-CM | POA: Diagnosis not present

## 2018-06-30 DIAGNOSIS — G20 Parkinson's disease: Secondary | ICD-10-CM | POA: Diagnosis not present

## 2018-06-30 DIAGNOSIS — N390 Urinary tract infection, site not specified: Secondary | ICD-10-CM | POA: Diagnosis not present

## 2018-06-30 DIAGNOSIS — R1310 Dysphagia, unspecified: Secondary | ICD-10-CM | POA: Diagnosis not present

## 2018-06-30 DIAGNOSIS — E46 Unspecified protein-calorie malnutrition: Secondary | ICD-10-CM | POA: Diagnosis not present

## 2018-06-30 DIAGNOSIS — F339 Major depressive disorder, recurrent, unspecified: Secondary | ICD-10-CM | POA: Diagnosis not present

## 2018-07-03 DIAGNOSIS — N390 Urinary tract infection, site not specified: Secondary | ICD-10-CM | POA: Diagnosis not present

## 2018-07-03 DIAGNOSIS — G903 Multi-system degeneration of the autonomic nervous system: Secondary | ICD-10-CM | POA: Diagnosis not present

## 2018-07-03 DIAGNOSIS — F339 Major depressive disorder, recurrent, unspecified: Secondary | ICD-10-CM | POA: Diagnosis not present

## 2018-07-03 DIAGNOSIS — E46 Unspecified protein-calorie malnutrition: Secondary | ICD-10-CM | POA: Diagnosis not present

## 2018-07-03 DIAGNOSIS — R1310 Dysphagia, unspecified: Secondary | ICD-10-CM | POA: Diagnosis not present

## 2018-07-03 DIAGNOSIS — G20 Parkinson's disease: Secondary | ICD-10-CM | POA: Diagnosis not present

## 2018-07-06 DIAGNOSIS — E46 Unspecified protein-calorie malnutrition: Secondary | ICD-10-CM | POA: Diagnosis not present

## 2018-07-06 DIAGNOSIS — N390 Urinary tract infection, site not specified: Secondary | ICD-10-CM | POA: Diagnosis not present

## 2018-07-06 DIAGNOSIS — F339 Major depressive disorder, recurrent, unspecified: Secondary | ICD-10-CM | POA: Diagnosis not present

## 2018-07-06 DIAGNOSIS — G903 Multi-system degeneration of the autonomic nervous system: Secondary | ICD-10-CM | POA: Diagnosis not present

## 2018-07-06 DIAGNOSIS — R1310 Dysphagia, unspecified: Secondary | ICD-10-CM | POA: Diagnosis not present

## 2018-07-06 DIAGNOSIS — G20 Parkinson's disease: Secondary | ICD-10-CM | POA: Diagnosis not present

## 2018-07-10 DIAGNOSIS — F339 Major depressive disorder, recurrent, unspecified: Secondary | ICD-10-CM | POA: Diagnosis not present

## 2018-07-10 DIAGNOSIS — R1310 Dysphagia, unspecified: Secondary | ICD-10-CM | POA: Diagnosis not present

## 2018-07-10 DIAGNOSIS — E46 Unspecified protein-calorie malnutrition: Secondary | ICD-10-CM | POA: Diagnosis not present

## 2018-07-10 DIAGNOSIS — N390 Urinary tract infection, site not specified: Secondary | ICD-10-CM | POA: Diagnosis not present

## 2018-07-10 DIAGNOSIS — G903 Multi-system degeneration of the autonomic nervous system: Secondary | ICD-10-CM | POA: Diagnosis not present

## 2018-07-10 DIAGNOSIS — G20 Parkinson's disease: Secondary | ICD-10-CM | POA: Diagnosis not present

## 2018-07-11 DIAGNOSIS — N390 Urinary tract infection, site not specified: Secondary | ICD-10-CM | POA: Diagnosis not present

## 2018-07-11 DIAGNOSIS — G20 Parkinson's disease: Secondary | ICD-10-CM | POA: Diagnosis not present

## 2018-07-11 DIAGNOSIS — R1310 Dysphagia, unspecified: Secondary | ICD-10-CM | POA: Diagnosis not present

## 2018-07-11 DIAGNOSIS — G903 Multi-system degeneration of the autonomic nervous system: Secondary | ICD-10-CM | POA: Diagnosis not present

## 2018-07-11 DIAGNOSIS — F339 Major depressive disorder, recurrent, unspecified: Secondary | ICD-10-CM | POA: Diagnosis not present

## 2018-07-11 DIAGNOSIS — E46 Unspecified protein-calorie malnutrition: Secondary | ICD-10-CM | POA: Diagnosis not present

## 2018-07-13 DIAGNOSIS — G20 Parkinson's disease: Secondary | ICD-10-CM | POA: Diagnosis not present

## 2018-07-13 DIAGNOSIS — N390 Urinary tract infection, site not specified: Secondary | ICD-10-CM | POA: Diagnosis not present

## 2018-07-13 DIAGNOSIS — E46 Unspecified protein-calorie malnutrition: Secondary | ICD-10-CM | POA: Diagnosis not present

## 2018-07-13 DIAGNOSIS — F339 Major depressive disorder, recurrent, unspecified: Secondary | ICD-10-CM | POA: Diagnosis not present

## 2018-07-13 DIAGNOSIS — R1310 Dysphagia, unspecified: Secondary | ICD-10-CM | POA: Diagnosis not present

## 2018-07-13 DIAGNOSIS — G903 Multi-system degeneration of the autonomic nervous system: Secondary | ICD-10-CM | POA: Diagnosis not present

## 2018-07-17 DIAGNOSIS — R1310 Dysphagia, unspecified: Secondary | ICD-10-CM | POA: Diagnosis not present

## 2018-07-17 DIAGNOSIS — G903 Multi-system degeneration of the autonomic nervous system: Secondary | ICD-10-CM | POA: Diagnosis not present

## 2018-07-17 DIAGNOSIS — E46 Unspecified protein-calorie malnutrition: Secondary | ICD-10-CM | POA: Diagnosis not present

## 2018-07-17 DIAGNOSIS — G20 Parkinson's disease: Secondary | ICD-10-CM | POA: Diagnosis not present

## 2018-07-17 DIAGNOSIS — F339 Major depressive disorder, recurrent, unspecified: Secondary | ICD-10-CM | POA: Diagnosis not present

## 2018-07-17 DIAGNOSIS — N390 Urinary tract infection, site not specified: Secondary | ICD-10-CM | POA: Diagnosis not present

## 2018-07-23 DIAGNOSIS — N390 Urinary tract infection, site not specified: Secondary | ICD-10-CM | POA: Diagnosis not present

## 2018-07-23 DIAGNOSIS — G903 Multi-system degeneration of the autonomic nervous system: Secondary | ICD-10-CM | POA: Diagnosis not present

## 2018-07-23 DIAGNOSIS — E46 Unspecified protein-calorie malnutrition: Secondary | ICD-10-CM | POA: Diagnosis not present

## 2018-07-23 DIAGNOSIS — G20 Parkinson's disease: Secondary | ICD-10-CM | POA: Diagnosis not present

## 2018-07-23 DIAGNOSIS — F339 Major depressive disorder, recurrent, unspecified: Secondary | ICD-10-CM | POA: Diagnosis not present

## 2018-07-23 DIAGNOSIS — R1310 Dysphagia, unspecified: Secondary | ICD-10-CM | POA: Diagnosis not present

## 2018-07-24 DIAGNOSIS — R1310 Dysphagia, unspecified: Secondary | ICD-10-CM | POA: Diagnosis not present

## 2018-07-24 DIAGNOSIS — G20 Parkinson's disease: Secondary | ICD-10-CM | POA: Diagnosis not present

## 2018-07-24 DIAGNOSIS — E46 Unspecified protein-calorie malnutrition: Secondary | ICD-10-CM | POA: Diagnosis not present

## 2018-07-24 DIAGNOSIS — N390 Urinary tract infection, site not specified: Secondary | ICD-10-CM | POA: Diagnosis not present

## 2018-07-24 DIAGNOSIS — F339 Major depressive disorder, recurrent, unspecified: Secondary | ICD-10-CM | POA: Diagnosis not present

## 2018-07-24 DIAGNOSIS — G903 Multi-system degeneration of the autonomic nervous system: Secondary | ICD-10-CM | POA: Diagnosis not present

## 2018-07-31 DIAGNOSIS — G903 Multi-system degeneration of the autonomic nervous system: Secondary | ICD-10-CM | POA: Diagnosis not present

## 2018-07-31 DIAGNOSIS — N390 Urinary tract infection, site not specified: Secondary | ICD-10-CM | POA: Diagnosis not present

## 2018-07-31 DIAGNOSIS — R1310 Dysphagia, unspecified: Secondary | ICD-10-CM | POA: Diagnosis not present

## 2018-07-31 DIAGNOSIS — E46 Unspecified protein-calorie malnutrition: Secondary | ICD-10-CM | POA: Diagnosis not present

## 2018-07-31 DIAGNOSIS — F339 Major depressive disorder, recurrent, unspecified: Secondary | ICD-10-CM | POA: Diagnosis not present

## 2018-07-31 DIAGNOSIS — G20 Parkinson's disease: Secondary | ICD-10-CM | POA: Diagnosis not present

## 2018-08-02 ENCOUNTER — Other Ambulatory Visit: Payer: Self-pay | Admitting: Neurology

## 2018-08-03 DIAGNOSIS — N390 Urinary tract infection, site not specified: Secondary | ICD-10-CM | POA: Diagnosis not present

## 2018-08-03 DIAGNOSIS — G20 Parkinson's disease: Secondary | ICD-10-CM | POA: Diagnosis not present

## 2018-08-03 DIAGNOSIS — F339 Major depressive disorder, recurrent, unspecified: Secondary | ICD-10-CM | POA: Diagnosis not present

## 2018-08-03 DIAGNOSIS — R1310 Dysphagia, unspecified: Secondary | ICD-10-CM | POA: Diagnosis not present

## 2018-08-03 DIAGNOSIS — E46 Unspecified protein-calorie malnutrition: Secondary | ICD-10-CM | POA: Diagnosis not present

## 2018-08-03 DIAGNOSIS — G903 Multi-system degeneration of the autonomic nervous system: Secondary | ICD-10-CM | POA: Diagnosis not present

## 2018-08-07 ENCOUNTER — Other Ambulatory Visit: Payer: Self-pay | Admitting: Neurology

## 2018-08-07 DIAGNOSIS — E46 Unspecified protein-calorie malnutrition: Secondary | ICD-10-CM | POA: Diagnosis not present

## 2018-08-07 DIAGNOSIS — F339 Major depressive disorder, recurrent, unspecified: Secondary | ICD-10-CM | POA: Diagnosis not present

## 2018-08-07 DIAGNOSIS — N390 Urinary tract infection, site not specified: Secondary | ICD-10-CM | POA: Diagnosis not present

## 2018-08-07 DIAGNOSIS — G903 Multi-system degeneration of the autonomic nervous system: Secondary | ICD-10-CM | POA: Diagnosis not present

## 2018-08-07 DIAGNOSIS — R1310 Dysphagia, unspecified: Secondary | ICD-10-CM | POA: Diagnosis not present

## 2018-08-07 DIAGNOSIS — G20 Parkinson's disease: Secondary | ICD-10-CM | POA: Diagnosis not present

## 2018-08-10 ENCOUNTER — Telehealth: Payer: Self-pay | Admitting: *Deleted

## 2018-08-10 ENCOUNTER — Other Ambulatory Visit: Payer: Self-pay | Admitting: Neurology

## 2018-08-10 MED ORDER — PYRIDOSTIGMINE BROMIDE 60 MG PO TABS: 60.0000 mg | ORAL_TABLET | Freq: Three times a day (TID) | ORAL | 1 refills | Status: AC

## 2018-08-10 MED ORDER — CARBIDOPA-LEVODOPA 25-100 MG PO TABS: ORAL_TABLET | ORAL | 1 refills | Status: AC

## 2018-08-10 NOTE — Telephone Encounter (Signed)
Refill requests received from pharmacy for patient's Sinemet 25-100mg , 2 tabs TID and Mestinon 60mg , 1 tab TID.  Per vo by Dr. Krista Blue, ok to provide refills for both medications.  They have been sent to the pharmacy.

## 2018-08-14 DIAGNOSIS — G20 Parkinson's disease: Secondary | ICD-10-CM | POA: Diagnosis not present

## 2018-08-14 DIAGNOSIS — G903 Multi-system degeneration of the autonomic nervous system: Secondary | ICD-10-CM | POA: Diagnosis not present

## 2018-08-14 DIAGNOSIS — R1310 Dysphagia, unspecified: Secondary | ICD-10-CM | POA: Diagnosis not present

## 2018-08-14 DIAGNOSIS — E46 Unspecified protein-calorie malnutrition: Secondary | ICD-10-CM | POA: Diagnosis not present

## 2018-08-14 DIAGNOSIS — N390 Urinary tract infection, site not specified: Secondary | ICD-10-CM | POA: Diagnosis not present

## 2018-08-14 DIAGNOSIS — F339 Major depressive disorder, recurrent, unspecified: Secondary | ICD-10-CM | POA: Diagnosis not present

## 2018-08-22 DIAGNOSIS — N390 Urinary tract infection, site not specified: Secondary | ICD-10-CM | POA: Diagnosis not present

## 2018-08-22 DIAGNOSIS — E46 Unspecified protein-calorie malnutrition: Secondary | ICD-10-CM | POA: Diagnosis not present

## 2018-08-22 DIAGNOSIS — F339 Major depressive disorder, recurrent, unspecified: Secondary | ICD-10-CM | POA: Diagnosis not present

## 2018-08-22 DIAGNOSIS — R1310 Dysphagia, unspecified: Secondary | ICD-10-CM | POA: Diagnosis not present

## 2018-08-22 DIAGNOSIS — G20 Parkinson's disease: Secondary | ICD-10-CM | POA: Diagnosis not present

## 2018-08-22 DIAGNOSIS — G903 Multi-system degeneration of the autonomic nervous system: Secondary | ICD-10-CM | POA: Diagnosis not present

## 2018-08-23 DIAGNOSIS — N390 Urinary tract infection, site not specified: Secondary | ICD-10-CM | POA: Diagnosis not present

## 2018-08-23 DIAGNOSIS — G20 Parkinson's disease: Secondary | ICD-10-CM | POA: Diagnosis not present

## 2018-08-23 DIAGNOSIS — R1310 Dysphagia, unspecified: Secondary | ICD-10-CM | POA: Diagnosis not present

## 2018-08-23 DIAGNOSIS — E46 Unspecified protein-calorie malnutrition: Secondary | ICD-10-CM | POA: Diagnosis not present

## 2018-08-23 DIAGNOSIS — G903 Multi-system degeneration of the autonomic nervous system: Secondary | ICD-10-CM | POA: Diagnosis not present

## 2018-08-23 DIAGNOSIS — F339 Major depressive disorder, recurrent, unspecified: Secondary | ICD-10-CM | POA: Diagnosis not present

## 2018-08-24 ENCOUNTER — Other Ambulatory Visit: Payer: Self-pay | Admitting: Neurology

## 2018-08-25 ENCOUNTER — Other Ambulatory Visit: Payer: Self-pay | Admitting: Neurology

## 2018-08-28 ENCOUNTER — Other Ambulatory Visit: Payer: Self-pay | Admitting: *Deleted

## 2018-08-28 DIAGNOSIS — G903 Multi-system degeneration of the autonomic nervous system: Secondary | ICD-10-CM | POA: Diagnosis not present

## 2018-08-28 DIAGNOSIS — R1310 Dysphagia, unspecified: Secondary | ICD-10-CM | POA: Diagnosis not present

## 2018-08-28 DIAGNOSIS — E46 Unspecified protein-calorie malnutrition: Secondary | ICD-10-CM | POA: Diagnosis not present

## 2018-08-28 DIAGNOSIS — G20 Parkinson's disease: Secondary | ICD-10-CM | POA: Diagnosis not present

## 2018-08-28 DIAGNOSIS — F339 Major depressive disorder, recurrent, unspecified: Secondary | ICD-10-CM | POA: Diagnosis not present

## 2018-08-28 DIAGNOSIS — N390 Urinary tract infection, site not specified: Secondary | ICD-10-CM | POA: Diagnosis not present

## 2018-08-28 MED ORDER — FLUDROCORTISONE ACETATE 0.1 MG PO TABS: 100.0000 ug | ORAL_TABLET | Freq: Every day | ORAL | 1 refills | Status: DC

## 2018-08-30 ENCOUNTER — Telehealth: Payer: Self-pay | Admitting: Neurology

## 2018-08-30 MED ORDER — FLUDROCORTISONE ACETATE 0.1 MG PO TABS: 0.1000 mg | ORAL_TABLET | Freq: Two times a day (BID) | ORAL | 0 refills | Status: DC

## 2018-08-30 MED ORDER — FLUDROCORTISONE ACETATE 0.1 MG PO TABS: 0.1000 mg | ORAL_TABLET | Freq: Two times a day (BID) | ORAL | 0 refills | Status: AC

## 2018-08-30 NOTE — Telephone Encounter (Signed)
For your review. The rx for fludrocortisone has already been sent to the pharmacy.  ----- Message -----  From: [name]  Sent: 08/27/2018 11:56 AM EST  To: [facility] Clinical Pool  Subject: Visit Follow-Up Question               Dear Dr. Krista Blue and Sharyn Lull,    Thank you for all your help on prescriptions in 2019. Jodi Cardenas has been doing quite well with her blood pressure in a sitting position (usually around 100-120 (S)/40-60(D). This has been achieved with fludrocortisone 0.1 mg QD and Sinemet (2 tabs,25/100 mg/ TID). Unfortunately I have ran out of fludrocortisone 0.1 mg as of January 5th. I thought I could get it at [facility] at [address] in [place]; however, they indicated that Sadae needs a new prescription for fludrocortisone. I am sorry to impose upon you again, but as fludrocortisone appears to be a critical component in maintaining Jodi Cardenas's BP, could you once again be so kind as to order a new prescription for fludrocortisone 0.1 mg QD for Jodi Cardenas. I would be greatly appreciated.     Jodi Cardenas appears to have some in improvements in some areas maybe associated with CBD (1000 mg) BID and [place] (1000 mg) QD. I just need to keep her appetite up to ingest enough calories & ptn for energy. Thanks   I have called in Fludrocortisone 0.1mg  bid to her pharmacy, also emailed her husband to be aware of the maximum recommended dose of fludrocortisone is 0.2mg /daily.   fludrocortisone (FLORINEF) 0.1 MG tablet 0.1 mg, 2 times daily 0 ordered       Summary: Take 1 tablet (0.1 mg total) by mouth 2 (two) times daily., Starting Wed 08/30/2018, Normal  Dose, Route, Frequency: 0.1 mg, Oral, 2 times daily Start: 08/30/2018 Ord/Sold: 08/30/2018 (O) Report Taking:  Long-term:  Pharmacy: [id number] - [place], [place] - [address] AT [place]

## 2018-09-04 DIAGNOSIS — R1310 Dysphagia, unspecified: Secondary | ICD-10-CM | POA: Diagnosis not present

## 2018-09-04 DIAGNOSIS — N390 Urinary tract infection, site not specified: Secondary | ICD-10-CM | POA: Diagnosis not present

## 2018-09-04 DIAGNOSIS — G903 Multi-system degeneration of the autonomic nervous system: Secondary | ICD-10-CM | POA: Diagnosis not present

## 2018-09-04 DIAGNOSIS — G20 Parkinson's disease: Secondary | ICD-10-CM | POA: Diagnosis not present

## 2018-09-04 DIAGNOSIS — F339 Major depressive disorder, recurrent, unspecified: Secondary | ICD-10-CM | POA: Diagnosis not present

## 2018-09-04 DIAGNOSIS — E46 Unspecified protein-calorie malnutrition: Secondary | ICD-10-CM | POA: Diagnosis not present

## 2018-09-11 DIAGNOSIS — E46 Unspecified protein-calorie malnutrition: Secondary | ICD-10-CM | POA: Diagnosis not present

## 2018-09-11 DIAGNOSIS — F339 Major depressive disorder, recurrent, unspecified: Secondary | ICD-10-CM | POA: Diagnosis not present

## 2018-09-11 DIAGNOSIS — N390 Urinary tract infection, site not specified: Secondary | ICD-10-CM | POA: Diagnosis not present

## 2018-09-11 DIAGNOSIS — G903 Multi-system degeneration of the autonomic nervous system: Secondary | ICD-10-CM | POA: Diagnosis not present

## 2018-09-11 DIAGNOSIS — R1310 Dysphagia, unspecified: Secondary | ICD-10-CM | POA: Diagnosis not present

## 2018-09-11 DIAGNOSIS — G20 Parkinson's disease: Secondary | ICD-10-CM | POA: Diagnosis not present

## 2018-09-18 DIAGNOSIS — G903 Multi-system degeneration of the autonomic nervous system: Secondary | ICD-10-CM | POA: Diagnosis not present

## 2018-09-18 DIAGNOSIS — F339 Major depressive disorder, recurrent, unspecified: Secondary | ICD-10-CM | POA: Diagnosis not present

## 2018-09-18 DIAGNOSIS — N390 Urinary tract infection, site not specified: Secondary | ICD-10-CM | POA: Diagnosis not present

## 2018-09-18 DIAGNOSIS — G20 Parkinson's disease: Secondary | ICD-10-CM | POA: Diagnosis not present

## 2018-09-18 DIAGNOSIS — E46 Unspecified protein-calorie malnutrition: Secondary | ICD-10-CM | POA: Diagnosis not present

## 2018-09-18 DIAGNOSIS — R1310 Dysphagia, unspecified: Secondary | ICD-10-CM | POA: Diagnosis not present

## 2018-09-23 DIAGNOSIS — G20 Parkinson's disease: Secondary | ICD-10-CM | POA: Diagnosis not present

## 2018-09-23 DIAGNOSIS — R1310 Dysphagia, unspecified: Secondary | ICD-10-CM | POA: Diagnosis not present

## 2018-09-23 DIAGNOSIS — N390 Urinary tract infection, site not specified: Secondary | ICD-10-CM | POA: Diagnosis not present

## 2018-09-23 DIAGNOSIS — E46 Unspecified protein-calorie malnutrition: Secondary | ICD-10-CM | POA: Diagnosis not present

## 2018-09-23 DIAGNOSIS — G903 Multi-system degeneration of the autonomic nervous system: Secondary | ICD-10-CM | POA: Diagnosis not present

## 2018-09-23 DIAGNOSIS — F339 Major depressive disorder, recurrent, unspecified: Secondary | ICD-10-CM | POA: Diagnosis not present

## 2018-09-25 DIAGNOSIS — G903 Multi-system degeneration of the autonomic nervous system: Secondary | ICD-10-CM | POA: Diagnosis not present

## 2018-09-25 DIAGNOSIS — R1310 Dysphagia, unspecified: Secondary | ICD-10-CM | POA: Diagnosis not present

## 2018-09-25 DIAGNOSIS — E46 Unspecified protein-calorie malnutrition: Secondary | ICD-10-CM | POA: Diagnosis not present

## 2018-09-25 DIAGNOSIS — G20 Parkinson's disease: Secondary | ICD-10-CM | POA: Diagnosis not present

## 2018-09-25 DIAGNOSIS — F339 Major depressive disorder, recurrent, unspecified: Secondary | ICD-10-CM | POA: Diagnosis not present

## 2018-09-25 DIAGNOSIS — N390 Urinary tract infection, site not specified: Secondary | ICD-10-CM | POA: Diagnosis not present

## 2018-09-26 DIAGNOSIS — F339 Major depressive disorder, recurrent, unspecified: Secondary | ICD-10-CM | POA: Diagnosis not present

## 2018-09-26 DIAGNOSIS — R1310 Dysphagia, unspecified: Secondary | ICD-10-CM | POA: Diagnosis not present

## 2018-09-26 DIAGNOSIS — G20 Parkinson's disease: Secondary | ICD-10-CM | POA: Diagnosis not present

## 2018-09-26 DIAGNOSIS — G903 Multi-system degeneration of the autonomic nervous system: Secondary | ICD-10-CM | POA: Diagnosis not present

## 2018-09-26 DIAGNOSIS — N390 Urinary tract infection, site not specified: Secondary | ICD-10-CM | POA: Diagnosis not present

## 2018-09-26 DIAGNOSIS — E46 Unspecified protein-calorie malnutrition: Secondary | ICD-10-CM | POA: Diagnosis not present

## 2018-09-27 DIAGNOSIS — E46 Unspecified protein-calorie malnutrition: Secondary | ICD-10-CM | POA: Diagnosis not present

## 2018-09-27 DIAGNOSIS — F339 Major depressive disorder, recurrent, unspecified: Secondary | ICD-10-CM | POA: Diagnosis not present

## 2018-09-27 DIAGNOSIS — G903 Multi-system degeneration of the autonomic nervous system: Secondary | ICD-10-CM | POA: Diagnosis not present

## 2018-09-27 DIAGNOSIS — G20 Parkinson's disease: Secondary | ICD-10-CM | POA: Diagnosis not present

## 2018-09-27 DIAGNOSIS — N390 Urinary tract infection, site not specified: Secondary | ICD-10-CM | POA: Diagnosis not present

## 2018-09-27 DIAGNOSIS — R1310 Dysphagia, unspecified: Secondary | ICD-10-CM | POA: Diagnosis not present

## 2018-09-28 DIAGNOSIS — G20 Parkinson's disease: Secondary | ICD-10-CM | POA: Diagnosis not present

## 2018-09-28 DIAGNOSIS — E46 Unspecified protein-calorie malnutrition: Secondary | ICD-10-CM | POA: Diagnosis not present

## 2018-09-28 DIAGNOSIS — N390 Urinary tract infection, site not specified: Secondary | ICD-10-CM | POA: Diagnosis not present

## 2018-09-28 DIAGNOSIS — G903 Multi-system degeneration of the autonomic nervous system: Secondary | ICD-10-CM | POA: Diagnosis not present

## 2018-09-28 DIAGNOSIS — R1310 Dysphagia, unspecified: Secondary | ICD-10-CM | POA: Diagnosis not present

## 2018-09-28 DIAGNOSIS — F339 Major depressive disorder, recurrent, unspecified: Secondary | ICD-10-CM | POA: Diagnosis not present

## 2018-09-29 DIAGNOSIS — R1310 Dysphagia, unspecified: Secondary | ICD-10-CM | POA: Diagnosis not present

## 2018-09-29 DIAGNOSIS — E46 Unspecified protein-calorie malnutrition: Secondary | ICD-10-CM | POA: Diagnosis not present

## 2018-09-29 DIAGNOSIS — F339 Major depressive disorder, recurrent, unspecified: Secondary | ICD-10-CM | POA: Diagnosis not present

## 2018-09-29 DIAGNOSIS — N390 Urinary tract infection, site not specified: Secondary | ICD-10-CM | POA: Diagnosis not present

## 2018-09-29 DIAGNOSIS — G20 Parkinson's disease: Secondary | ICD-10-CM | POA: Diagnosis not present

## 2018-09-29 DIAGNOSIS — G903 Multi-system degeneration of the autonomic nervous system: Secondary | ICD-10-CM | POA: Diagnosis not present

## 2018-10-02 DIAGNOSIS — R1310 Dysphagia, unspecified: Secondary | ICD-10-CM | POA: Diagnosis not present

## 2018-10-02 DIAGNOSIS — G903 Multi-system degeneration of the autonomic nervous system: Secondary | ICD-10-CM | POA: Diagnosis not present

## 2018-10-02 DIAGNOSIS — G20 Parkinson's disease: Secondary | ICD-10-CM | POA: Diagnosis not present

## 2018-10-02 DIAGNOSIS — N390 Urinary tract infection, site not specified: Secondary | ICD-10-CM | POA: Diagnosis not present

## 2018-10-02 DIAGNOSIS — F339 Major depressive disorder, recurrent, unspecified: Secondary | ICD-10-CM | POA: Diagnosis not present

## 2018-10-02 DIAGNOSIS — E46 Unspecified protein-calorie malnutrition: Secondary | ICD-10-CM | POA: Diagnosis not present

## 2018-10-05 DIAGNOSIS — G903 Multi-system degeneration of the autonomic nervous system: Secondary | ICD-10-CM | POA: Diagnosis not present

## 2018-10-05 DIAGNOSIS — E46 Unspecified protein-calorie malnutrition: Secondary | ICD-10-CM | POA: Diagnosis not present

## 2018-10-05 DIAGNOSIS — N390 Urinary tract infection, site not specified: Secondary | ICD-10-CM | POA: Diagnosis not present

## 2018-10-05 DIAGNOSIS — G20 Parkinson's disease: Secondary | ICD-10-CM | POA: Diagnosis not present

## 2018-10-05 DIAGNOSIS — R1310 Dysphagia, unspecified: Secondary | ICD-10-CM | POA: Diagnosis not present

## 2018-10-05 DIAGNOSIS — F339 Major depressive disorder, recurrent, unspecified: Secondary | ICD-10-CM | POA: Diagnosis not present

## 2018-10-09 DIAGNOSIS — F339 Major depressive disorder, recurrent, unspecified: Secondary | ICD-10-CM | POA: Diagnosis not present

## 2018-10-09 DIAGNOSIS — G20 Parkinson's disease: Secondary | ICD-10-CM | POA: Diagnosis not present

## 2018-10-09 DIAGNOSIS — N390 Urinary tract infection, site not specified: Secondary | ICD-10-CM | POA: Diagnosis not present

## 2018-10-09 DIAGNOSIS — G903 Multi-system degeneration of the autonomic nervous system: Secondary | ICD-10-CM | POA: Diagnosis not present

## 2018-10-09 DIAGNOSIS — E46 Unspecified protein-calorie malnutrition: Secondary | ICD-10-CM | POA: Diagnosis not present

## 2018-10-09 DIAGNOSIS — R1310 Dysphagia, unspecified: Secondary | ICD-10-CM | POA: Diagnosis not present

## 2018-10-12 DIAGNOSIS — G20 Parkinson's disease: Secondary | ICD-10-CM | POA: Diagnosis not present

## 2018-10-12 DIAGNOSIS — G903 Multi-system degeneration of the autonomic nervous system: Secondary | ICD-10-CM | POA: Diagnosis not present

## 2018-10-12 DIAGNOSIS — F339 Major depressive disorder, recurrent, unspecified: Secondary | ICD-10-CM | POA: Diagnosis not present

## 2018-10-12 DIAGNOSIS — N390 Urinary tract infection, site not specified: Secondary | ICD-10-CM | POA: Diagnosis not present

## 2018-10-12 DIAGNOSIS — R1310 Dysphagia, unspecified: Secondary | ICD-10-CM | POA: Diagnosis not present

## 2018-10-12 DIAGNOSIS — E46 Unspecified protein-calorie malnutrition: Secondary | ICD-10-CM | POA: Diagnosis not present

## 2018-10-16 DIAGNOSIS — G903 Multi-system degeneration of the autonomic nervous system: Secondary | ICD-10-CM | POA: Diagnosis not present

## 2018-10-16 DIAGNOSIS — N390 Urinary tract infection, site not specified: Secondary | ICD-10-CM | POA: Diagnosis not present

## 2018-10-16 DIAGNOSIS — F339 Major depressive disorder, recurrent, unspecified: Secondary | ICD-10-CM | POA: Diagnosis not present

## 2018-10-16 DIAGNOSIS — G20 Parkinson's disease: Secondary | ICD-10-CM | POA: Diagnosis not present

## 2018-10-16 DIAGNOSIS — E46 Unspecified protein-calorie malnutrition: Secondary | ICD-10-CM | POA: Diagnosis not present

## 2018-10-16 DIAGNOSIS — R1310 Dysphagia, unspecified: Secondary | ICD-10-CM | POA: Diagnosis not present

## 2018-10-18 DIAGNOSIS — N390 Urinary tract infection, site not specified: Secondary | ICD-10-CM | POA: Diagnosis not present

## 2018-10-18 DIAGNOSIS — E46 Unspecified protein-calorie malnutrition: Secondary | ICD-10-CM | POA: Diagnosis not present

## 2018-10-18 DIAGNOSIS — R1310 Dysphagia, unspecified: Secondary | ICD-10-CM | POA: Diagnosis not present

## 2018-10-18 DIAGNOSIS — F339 Major depressive disorder, recurrent, unspecified: Secondary | ICD-10-CM | POA: Diagnosis not present

## 2018-10-18 DIAGNOSIS — G903 Multi-system degeneration of the autonomic nervous system: Secondary | ICD-10-CM | POA: Diagnosis not present

## 2018-10-18 DIAGNOSIS — G20 Parkinson's disease: Secondary | ICD-10-CM | POA: Diagnosis not present

## 2018-10-19 DIAGNOSIS — R1310 Dysphagia, unspecified: Secondary | ICD-10-CM | POA: Diagnosis not present

## 2018-10-19 DIAGNOSIS — N390 Urinary tract infection, site not specified: Secondary | ICD-10-CM | POA: Diagnosis not present

## 2018-10-19 DIAGNOSIS — G20 Parkinson's disease: Secondary | ICD-10-CM | POA: Diagnosis not present

## 2018-10-19 DIAGNOSIS — G903 Multi-system degeneration of the autonomic nervous system: Secondary | ICD-10-CM | POA: Diagnosis not present

## 2018-10-19 DIAGNOSIS — F339 Major depressive disorder, recurrent, unspecified: Secondary | ICD-10-CM | POA: Diagnosis not present

## 2018-10-19 DIAGNOSIS — E46 Unspecified protein-calorie malnutrition: Secondary | ICD-10-CM | POA: Diagnosis not present

## 2018-10-21 DIAGNOSIS — G20 Parkinson's disease: Secondary | ICD-10-CM | POA: Diagnosis not present

## 2018-10-21 DIAGNOSIS — F339 Major depressive disorder, recurrent, unspecified: Secondary | ICD-10-CM | POA: Diagnosis not present

## 2018-10-21 DIAGNOSIS — N390 Urinary tract infection, site not specified: Secondary | ICD-10-CM | POA: Diagnosis not present

## 2018-10-21 DIAGNOSIS — R1310 Dysphagia, unspecified: Secondary | ICD-10-CM | POA: Diagnosis not present

## 2018-10-21 DIAGNOSIS — E46 Unspecified protein-calorie malnutrition: Secondary | ICD-10-CM | POA: Diagnosis not present

## 2018-10-21 DIAGNOSIS — G903 Multi-system degeneration of the autonomic nervous system: Secondary | ICD-10-CM | POA: Diagnosis not present

## 2018-10-22 DIAGNOSIS — E46 Unspecified protein-calorie malnutrition: Secondary | ICD-10-CM | POA: Diagnosis not present

## 2018-10-22 DIAGNOSIS — G20 Parkinson's disease: Secondary | ICD-10-CM | POA: Diagnosis not present

## 2018-10-22 DIAGNOSIS — G903 Multi-system degeneration of the autonomic nervous system: Secondary | ICD-10-CM | POA: Diagnosis not present

## 2018-10-22 DIAGNOSIS — N390 Urinary tract infection, site not specified: Secondary | ICD-10-CM | POA: Diagnosis not present

## 2018-10-22 DIAGNOSIS — R1310 Dysphagia, unspecified: Secondary | ICD-10-CM | POA: Diagnosis not present

## 2018-10-22 DIAGNOSIS — F339 Major depressive disorder, recurrent, unspecified: Secondary | ICD-10-CM | POA: Diagnosis not present

## 2018-10-23 DIAGNOSIS — G20 Parkinson's disease: Secondary | ICD-10-CM | POA: Diagnosis not present

## 2018-10-23 DIAGNOSIS — G903 Multi-system degeneration of the autonomic nervous system: Secondary | ICD-10-CM | POA: Diagnosis not present

## 2018-10-23 DIAGNOSIS — N390 Urinary tract infection, site not specified: Secondary | ICD-10-CM | POA: Diagnosis not present

## 2018-10-23 DIAGNOSIS — E46 Unspecified protein-calorie malnutrition: Secondary | ICD-10-CM | POA: Diagnosis not present

## 2018-10-23 DIAGNOSIS — R1310 Dysphagia, unspecified: Secondary | ICD-10-CM | POA: Diagnosis not present

## 2018-10-23 DIAGNOSIS — F339 Major depressive disorder, recurrent, unspecified: Secondary | ICD-10-CM | POA: Diagnosis not present

## 2018-10-25 DIAGNOSIS — G903 Multi-system degeneration of the autonomic nervous system: Secondary | ICD-10-CM | POA: Diagnosis not present

## 2018-10-25 DIAGNOSIS — R1310 Dysphagia, unspecified: Secondary | ICD-10-CM | POA: Diagnosis not present

## 2018-10-25 DIAGNOSIS — N390 Urinary tract infection, site not specified: Secondary | ICD-10-CM | POA: Diagnosis not present

## 2018-10-25 DIAGNOSIS — E46 Unspecified protein-calorie malnutrition: Secondary | ICD-10-CM | POA: Diagnosis not present

## 2018-10-25 DIAGNOSIS — F339 Major depressive disorder, recurrent, unspecified: Secondary | ICD-10-CM | POA: Diagnosis not present

## 2018-10-25 DIAGNOSIS — G20 Parkinson's disease: Secondary | ICD-10-CM | POA: Diagnosis not present

## 2018-10-26 DIAGNOSIS — R1310 Dysphagia, unspecified: Secondary | ICD-10-CM | POA: Diagnosis not present

## 2018-10-26 DIAGNOSIS — E46 Unspecified protein-calorie malnutrition: Secondary | ICD-10-CM | POA: Diagnosis not present

## 2018-10-26 DIAGNOSIS — N390 Urinary tract infection, site not specified: Secondary | ICD-10-CM | POA: Diagnosis not present

## 2018-10-26 DIAGNOSIS — G903 Multi-system degeneration of the autonomic nervous system: Secondary | ICD-10-CM | POA: Diagnosis not present

## 2018-10-26 DIAGNOSIS — F339 Major depressive disorder, recurrent, unspecified: Secondary | ICD-10-CM | POA: Diagnosis not present

## 2018-10-26 DIAGNOSIS — G20 Parkinson's disease: Secondary | ICD-10-CM | POA: Diagnosis not present

## 2018-11-22 DEATH — deceased

## 2018-11-29 IMAGING — DX DG CHEST 1V PORT
1 series · 1 of 1 positions shown · non-contrast
Comparison: 10/15/2017

CLINICAL DATA: Shortness of breath

EXAM:
PORTABLE CHEST 1 VIEW

[chest ap]
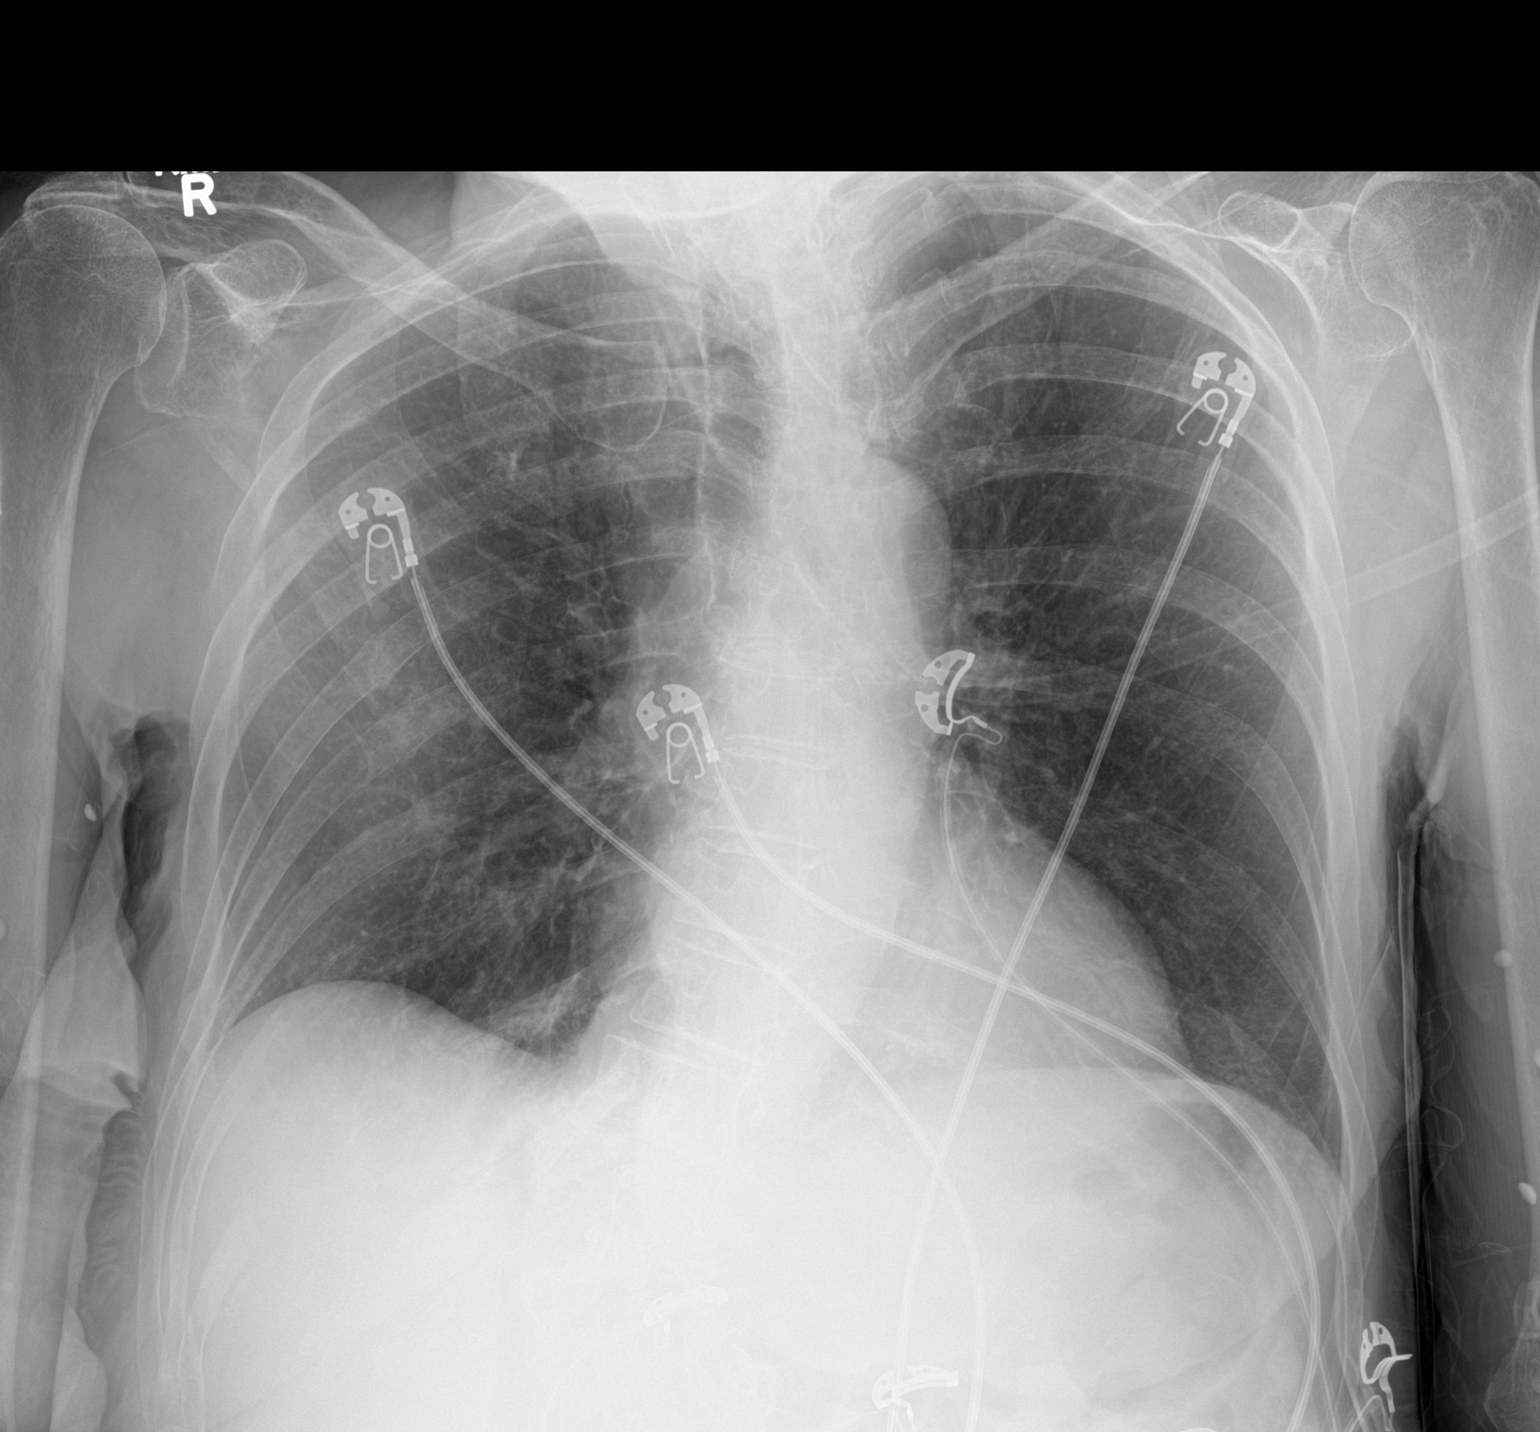

[1 of 1 positions shown; findings below may reference images not displayed]

FINDINGS: No focal opacity or pleural effusion. Normal cardiomediastinal
silhouette. No pneumothorax. Old right-sided rib fractures.
IMPRESSION: No active disease.

## 2020-05-21 ENCOUNTER — Telehealth: Payer: Self-pay | Admitting: Cardiovascular Disease

## 2020-05-21 NOTE — Telephone Encounter (Signed)
Wrong chart, I did not need this chart

## 2021-03-31 LAB — HM COLONOSCOPY
